# Patient Record
Sex: Female | Born: 1975 | Race: White | Hispanic: No | Marital: Married | Smoking: Never smoker
Health system: Southern US, Community
[De-identification: ages and names within clinical notes are randomized; demographics above are authoritative.]

## PROBLEM LIST (undated history)

## (undated) DIAGNOSIS — Z141 Cystic fibrosis carrier: Secondary | ICD-10-CM

## (undated) DIAGNOSIS — R87619 Unspecified abnormal cytological findings in specimens from cervix uteri: Secondary | ICD-10-CM

## (undated) DIAGNOSIS — R51 Headache: Secondary | ICD-10-CM

## (undated) DIAGNOSIS — IMO0002 Reserved for concepts with insufficient information to code with codable children: Secondary | ICD-10-CM

## (undated) DIAGNOSIS — K219 Gastro-esophageal reflux disease without esophagitis: Secondary | ICD-10-CM

## (undated) DIAGNOSIS — O09529 Supervision of elderly multigravida, unspecified trimester: Secondary | ICD-10-CM

## (undated) HISTORY — DX: Supervision of elderly multigravida, unspecified trimester: O09.529

## (undated) HISTORY — DX: Reserved for concepts with insufficient information to code with codable children: IMO0002

## (undated) HISTORY — PX: NO PAST SURGERIES: SHX2092

## (undated) HISTORY — DX: Unspecified abnormal cytological findings in specimens from cervix uteri: R87.619

---

## 2004-03-15 ENCOUNTER — Other Ambulatory Visit: Admission: RE | Admit: 2004-03-15 | Discharge: 2004-03-15 | Payer: Self-pay | Admitting: Gynecology

## 2005-03-23 ENCOUNTER — Other Ambulatory Visit: Admission: RE | Admit: 2005-03-23 | Discharge: 2005-03-23 | Payer: Self-pay | Admitting: Gynecology

## 2005-08-07 ENCOUNTER — Emergency Department (HOSPITAL_COMMUNITY): Admission: EM | Admit: 2005-08-07 | Discharge: 2005-08-07 | Payer: Self-pay | Admitting: Family Medicine

## 2007-05-09 HISTORY — PX: COLPOSCOPY: SHX161

## 2009-01-29 ENCOUNTER — Inpatient Hospital Stay (HOSPITAL_COMMUNITY): Admission: AD | Admit: 2009-01-29 | Discharge: 2009-01-31 | Payer: Self-pay | Admitting: Obstetrics and Gynecology

## 2010-08-12 LAB — COMPREHENSIVE METABOLIC PANEL
ALT: 17 U/L (ref 0–35)
AST: 25 U/L (ref 0–37)
Albumin: 2.6 g/dL — ABNORMAL LOW (ref 3.5–5.2)
Chloride: 105 mEq/L (ref 96–112)
Creatinine, Ser: 0.82 mg/dL (ref 0.4–1.2)
GFR calc Af Amer: >60 mL/min (ref >60)
Potassium: 4.3 mEq/L (ref 3.5–5.1)
Sodium: 137 mEq/L (ref 135–145)
Total Bilirubin: 0.4 mg/dL (ref 0.3–1.2)

## 2010-08-12 LAB — CBC
HCT: 29.2 % — ABNORMAL LOW (ref 36.0–46.0)
Hemoglobin: 10 g/dL — ABNORMAL LOW (ref 12.0–15.0)
MCV: 87.2 fL (ref 78.0–100.0)
Platelets: 197 10*3/uL (ref 150–400)
RDW: 13.8 % (ref 11.5–15.5)
WBC: 8.9 10*3/uL (ref 4.0–10.5)

## 2010-08-12 LAB — URIC ACID: Uric Acid, Serum: 5.8 mg/dL (ref 2.4–7.0)

## 2010-12-22 LAB — ANTIBODY SCREEN: Antibody Screen: NEGATIVE

## 2010-12-22 LAB — HEPATITIS B SURFACE ANTIGEN: Hepatitis B Surface Ag: NEGATIVE

## 2010-12-22 LAB — ABO/RH

## 2010-12-22 LAB — GC/CHLAMYDIA PROBE AMP, GENITAL: Gonorrhea: NEGATIVE

## 2010-12-22 LAB — RPR: RPR: NONREACTIVE

## 2011-04-26 ENCOUNTER — Other Ambulatory Visit (HOSPITAL_COMMUNITY): Payer: Self-pay | Admitting: Obstetrics and Gynecology

## 2011-04-26 DIAGNOSIS — IMO0002 Reserved for concepts with insufficient information to code with codable children: Secondary | ICD-10-CM

## 2011-04-27 ENCOUNTER — Other Ambulatory Visit (HOSPITAL_COMMUNITY): Payer: Self-pay | Admitting: Obstetrics and Gynecology

## 2011-04-27 ENCOUNTER — Ambulatory Visit (HOSPITAL_COMMUNITY)
Admission: RE | Admit: 2011-04-27 | Discharge: 2011-04-27 | Disposition: A | Discharge status: Home / Self Care | Payer: BC Managed Care – PPO | Source: Ambulatory Visit | Attending: Obstetrics and Gynecology | Admitting: Obstetrics and Gynecology

## 2011-04-27 ENCOUNTER — Encounter (HOSPITAL_COMMUNITY): Payer: Self-pay

## 2011-04-27 DIAGNOSIS — Z1389 Encounter for screening for other disorder: Secondary | ICD-10-CM | POA: Insufficient documentation

## 2011-04-27 DIAGNOSIS — Z363 Encounter for antenatal screening for malformations: Secondary | ICD-10-CM | POA: Insufficient documentation

## 2011-04-27 DIAGNOSIS — O358XX0 Maternal care for other (suspected) fetal abnormality and damage, not applicable or unspecified: Secondary | ICD-10-CM | POA: Insufficient documentation

## 2011-04-27 DIAGNOSIS — IMO0002 Reserved for concepts with insufficient information to code with codable children: Secondary | ICD-10-CM

## 2011-04-27 DIAGNOSIS — O43899 Other placental disorders, unspecified trimester: Secondary | ICD-10-CM | POA: Insufficient documentation

## 2011-04-27 DIAGNOSIS — O09529 Supervision of elderly multigravida, unspecified trimester: Secondary | ICD-10-CM | POA: Insufficient documentation

## 2011-04-27 DIAGNOSIS — O4100X0 Oligohydramnios, unspecified trimester, not applicable or unspecified: Secondary | ICD-10-CM | POA: Insufficient documentation

## 2011-05-02 ENCOUNTER — Inpatient Hospital Stay (HOSPITAL_COMMUNITY): Payer: BC Managed Care – PPO

## 2011-05-02 ENCOUNTER — Inpatient Hospital Stay (HOSPITAL_COMMUNITY)
Admission: AD | Admit: 2011-05-02 | Discharge: 2011-05-04 | DRG: 886 | Disposition: A | Discharge status: Home / Self Care | Payer: BC Managed Care – PPO | Source: Ambulatory Visit | Attending: Obstetrics and Gynecology | Admitting: Obstetrics and Gynecology

## 2011-05-02 ENCOUNTER — Encounter (HOSPITAL_COMMUNITY): Payer: Self-pay | Admitting: *Deleted

## 2011-05-02 DIAGNOSIS — K529 Noninfective gastroenteritis and colitis, unspecified: Secondary | ICD-10-CM

## 2011-05-02 DIAGNOSIS — A088 Other specified intestinal infections: Secondary | ICD-10-CM | POA: Diagnosis present

## 2011-05-02 DIAGNOSIS — O99891 Other specified diseases and conditions complicating pregnancy: Principal | ICD-10-CM | POA: Diagnosis present

## 2011-05-02 HISTORY — DX: Cystic fibrosis carrier: Z14.1

## 2011-05-02 HISTORY — DX: Gastro-esophageal reflux disease without esophagitis: K21.9

## 2011-05-02 LAB — DIFFERENTIAL
Basophils Absolute: 0 10*3/uL (ref 0.0–0.1)
Basophils Relative: 0 % (ref 0–1)
Eosinophils Absolute: 0 10*3/uL (ref 0.0–0.7)
Monocytes Absolute: 0.2 10*3/uL (ref 0.1–1.0)
Monocytes Relative: 2 % — ABNORMAL LOW (ref 3–12)
Neutrophils Relative %: 94 % — ABNORMAL HIGH (ref 43–77)

## 2011-05-02 LAB — COMPREHENSIVE METABOLIC PANEL
ALT: 14 U/L (ref 0–35)
AST: 17 U/L (ref 0–37)
Alkaline Phosphatase: 111 U/L (ref 39–117)
CO2: 22 mEq/L (ref 19–32)
Calcium: 8.5 mg/dL (ref 8.4–10.5)
Chloride: 104 mEq/L (ref 96–112)
GFR calc Af Amer: >90 mL/min (ref >90)
GFR calc non Af Amer: >90 mL/min (ref >90)
Glucose, Bld: 83 mg/dL (ref 70–99)
Potassium: 3.9 mEq/L (ref 3.5–5.1)
Sodium: 135 mEq/L (ref 135–145)
Total Bilirubin: 0.2 mg/dL — ABNORMAL LOW (ref 0.3–1.2)

## 2011-05-02 LAB — STREP B DNA PROBE

## 2011-05-02 LAB — CBC
HCT: 37.8 % (ref 36.0–46.0)
Hemoglobin: 13.2 g/dL (ref 12.0–15.0)
MCH: 29.3 pg (ref 26.0–34.0)
MCHC: 34.9 g/dL (ref 30.0–36.0)

## 2011-05-02 MED ORDER — PROMETHAZINE HCL 25 MG/ML IJ SOLN
12.5000 mg | Freq: Four times a day (QID) | INTRAMUSCULAR | Status: DC | PRN
  Administered 2011-05-02: 12.5 mg via INTRAVENOUS
  Filled 2011-05-02: qty 1

## 2011-05-02 MED ORDER — DOCUSATE SODIUM 100 MG PO CAPS
100.0000 mg | ORAL_CAPSULE | Freq: Every day | ORAL | Status: DC
  Filled 2011-05-02 (×2): qty 1

## 2011-05-02 MED ORDER — ONDANSETRON HCL 4 MG/2ML IJ SOLN
4.0000 mg | Freq: Once | INTRAMUSCULAR | Status: AC
  Administered 2011-05-02: 4 mg via INTRAVENOUS
  Filled 2011-05-02 (×2): qty 2

## 2011-05-02 MED ORDER — PROMETHAZINE HCL 25 MG/ML IJ SOLN
25.0000 mg | Freq: Four times a day (QID) | INTRAMUSCULAR | Status: DC | PRN
  Filled 2011-05-02: qty 1

## 2011-05-02 MED ORDER — PRENATAL MULTIVITAMIN CH: 1.0000 | ORAL_TABLET | Freq: Every day | ORAL | Status: DC

## 2011-05-02 MED ORDER — LACTATED RINGERS IV SOLN
INTRAVENOUS | Status: DC
  Administered 2011-05-02: 16:00:00 via INTRAVENOUS

## 2011-05-02 MED ORDER — ONDANSETRON HCL 4 MG/2ML IJ SOLN
4.0000 mg | Freq: Three times a day (TID) | INTRAMUSCULAR | Status: DC | PRN
  Administered 2011-05-04: 4 mg via INTRAVENOUS
  Filled 2011-05-02: qty 2

## 2011-05-02 MED ORDER — DEXTROSE IN LACTATED RINGERS 5 % IV SOLN
INTRAVENOUS | Status: DC
  Administered 2011-05-02 – 2011-05-04 (×4): via INTRAVENOUS

## 2011-05-02 MED ORDER — ZOLPIDEM TARTRATE 10 MG PO TABS
10.0000 mg | ORAL_TABLET | Freq: Every evening | ORAL | Status: DC | PRN
  Administered 2011-05-04: 10 mg via ORAL
  Filled 2011-05-02: qty 1

## 2011-05-02 MED ORDER — CALCIUM CARBONATE ANTACID 500 MG PO CHEW
2.0000 | CHEWABLE_TABLET | ORAL | Status: DC | PRN
  Filled 2011-05-02: qty 2

## 2011-05-02 MED ORDER — ACETAMINOPHEN 325 MG PO TABS
650.0000 mg | ORAL_TABLET | ORAL | Status: DC | PRN
  Administered 2011-05-02 – 2011-05-03 (×3): 650 mg via ORAL
  Filled 2011-05-02 (×3): qty 2

## 2011-05-02 MED ORDER — PANTOPRAZOLE SODIUM 40 MG IV SOLR
40.0000 mg | Freq: Once | INTRAVENOUS | Status: AC
  Administered 2011-05-02: 40 mg via INTRAVENOUS
  Filled 2011-05-02: qty 40

## 2011-05-02 NOTE — Progress Notes (Signed)
Dr Rana Snare notified regarding pt request for Protonix. Order received. Also made aware of FHR tracing, and pt complaints of lower abdominal cramping. Order also received to modify previous Phenergan order.

## 2011-05-02 NOTE — H&P (Signed)
Kristen Peterson is a 35 y.o. female presenting for severe diarrhea, nausea and vomiting since 1030 this am.  Her child had a viral syndrome similar to this 2 days ago and her husband now has it as well.  GFM,  no ctxs.  Does have a history of oligohydraminos last pregnancy. History OB History    Grav Para Term Preterm Abortions TAB SAB Ect Mult Living   3 1 1  0 1 0 1 0 0 1     Past Medical History  Diagnosis Date  . No pertinent past medical history    Past Surgical History  Procedure Date  . No past surgeries    Family History: family history includes Diverticulitis in her father; Hyperlipidemia in her father; Hypertension in her father; Osteoporosis in her mother; and Thyroid disease in her mother. Social History:  reports that she has quit smoking. She does not have any smokeless tobacco history on file. She reports that she does not drink alcohol or use illicit drugs.  ROS    Blood pressure 122/66, pulse 81, temperature 98.3 F (36.8 C), temperature source Oral, resp. rate 18, last menstrual period 09/09/2010, SpO2 99.00%. Exam Physical Exam   Gen:  Patient appears weak and generalized malaise abd gravid nt  FHR - had several variable decels so had BPP 8/8 with normal AFI Ctxs none  Prenatal labs: ABO, Rh:   Antibody:   Rubella:   RPR:    HBsAg:    HIV:    GBS:     Assessment/Plan: IUP at 33+weeks with GI viral syndrome.   Plan prolonged IVF and fetal monitoring with antiemetics   Ved Martos C 05/02/2011, 7:27 PM

## 2011-05-02 NOTE — Progress Notes (Signed)
Pt states she has been vomiting and has had diarrhea since about 1030 this morning. Husband at home with flu and small child that had vomiting and diarrhea 2nights ago

## 2011-05-02 NOTE — ED Provider Notes (Signed)
History     CSN: [id number]  Arrival date & time 05/02/11  1503   None     Chief Complaint  Patient presents with  . Emesis  . Diarrhea    HPI Kristen Peterson is a 35 y.o. female @ [redacted]w[redacted]d gestation who presents to [facility] for nausea, vomiting and diarrhea. Onset of symptoms at 10:30 am. Seems to be getting worse. Denies vaginal bleeding or leaking of fluid. Cramping abdominal pain with the diarrhea. Family has had the GI bug.  The history was provided by the patient.  Past Medical History  Diagnosis Date  . No pertinent past medical history     Past Surgical History  Procedure Date  . No past surgeries     Family History  Problem Relation Age of Onset  . Osteoporosis Mother   . Thyroid disease Mother   . Hypertension Father   . Hyperlipidemia Father   . Diverticulitis Father     History  Substance Use Topics  . Smoking status: Former Games developer  . Smokeless tobacco: Not on file  . Alcohol Use: No    OB History    Grav Para Term Preterm Abortions TAB SAB Ect Mult Living   3 1 1  0 1 0 1 0 0 1      Review of Systems  Constitutional: Positive for chills. Negative for fever.  Eyes: Negative.   Cardiovascular: Negative.   Gastrointestinal: Positive for nausea, vomiting, abdominal pain and diarrhea.  Genitourinary: Negative for vaginal bleeding, vaginal discharge and difficulty urinating.  Musculoskeletal: Positive for back pain.  Skin: Negative.   Neurological: Positive for headaches.  Psychiatric/Behavioral: Negative for confusion and agitation.    Allergies  Review of patient's allergies indicates no known allergies.  Home Medications  No current outpatient prescriptions on file.  BP 122/66  Pulse 89  Temp(Src) 98.3 F (36.8 C) (Oral)  Resp 18  LMP 09/09/2010  Physical Exam  Nursing note and vitals reviewed. Constitutional: She is oriented to person, place, and time. She appears well-developed and well-nourished.  HENT:  Head: Normocephalic.  Eyes: EOM  are normal.  Neck: Normal range of motion. Neck supple.  Cardiovascular: Normal rate.   Pulmonary/Chest: Effort normal.  Abdominal: Soft. There is no tenderness.       Gravid, consistent with dates.  Musculoskeletal: Normal range of motion.  Neurological: She is alert and oriented to person, place, and time. No cranial nerve deficit.  Skin: Skin is warm and dry.  Psychiatric: She has a normal mood and affect. Her behavior is normal. Judgment and thought content normal.   EFM: Initial tracing reactive and rare contraction. Baseline 150.  Patient now having variables 17:00 Dr. Rana Snare notified. Will order AFI and BPP.  Assessment: Gastroenteritis in third trimester pregnancy   Dehydration  Plan:  IV hydration   Zofran  ED Course: Dr. Rana Snare here to evaluate the patient and will admit for observation and monitoring.  Procedures  MDM          Kerrie Buffalo, NP 05/02/11 1939

## 2011-05-03 MED ORDER — OXYCODONE-ACETAMINOPHEN 5-325 MG PO TABS
2.0000 | ORAL_TABLET | ORAL | Status: DC | PRN
  Administered 2011-05-03: 2 via ORAL
  Filled 2011-05-03: qty 2

## 2011-05-03 MED ORDER — BUTALBITAL-APAP-CAFFEINE 50-325-40 MG PO TABS
1.0000 | ORAL_TABLET | Freq: Once | ORAL | Status: AC
  Administered 2011-05-03: 1 via ORAL
  Filled 2011-05-03: qty 1

## 2011-05-03 MED ORDER — PANTOPRAZOLE SODIUM 40 MG PO TBEC
40.0000 mg | DELAYED_RELEASE_TABLET | Freq: Every day | ORAL | Status: DC
  Administered 2011-05-03 – 2011-05-04 (×2): 40 mg via ORAL
  Filled 2011-05-03 (×3): qty 1

## 2011-05-03 MED ORDER — DIPHENOXYLATE-ATROPINE 2.5-0.025 MG PO TABS
1.0000 | ORAL_TABLET | Freq: Three times a day (TID) | ORAL | Status: DC | PRN
  Administered 2011-05-03 – 2011-05-04 (×4): 1 via ORAL
  Filled 2011-05-03 (×4): qty 1

## 2011-05-03 NOTE — Progress Notes (Signed)
Feeling a little better.  Tolerating some PO intake  AF, VSS FHT +, did have 1 min decel to 60s this am.  Reassuring since then.    Gen - NAD, ill appearing Abd - gravid, NT  A/P:  Continue IV hydration and antiemetics prn [place] for BPP & dopplers w/ MFM

## 2011-05-03 NOTE — Progress Notes (Signed)
Dr Rana Snare notified of variable at [phone]. Updated on FHR and occasional contractions. No orders received. Also updated on pt status during night.

## 2011-05-04 ENCOUNTER — Inpatient Hospital Stay (HOSPITAL_COMMUNITY): Payer: BC Managed Care – PPO

## 2011-05-04 MED ORDER — FERROUS SULFATE 325 (65 FE) MG PO TABS: 325.0000 mg | ORAL_TABLET | Freq: Every day | ORAL | Status: DC

## 2011-05-04 NOTE — Progress Notes (Signed)
Patient tolerated regular food. BPP 8/8 today AFI 7.92 Will discharge home followup in office.

## 2011-05-04 NOTE — Progress Notes (Signed)
This am, Patient is feeling better. Is going to eat a regular breakfast. Husband may still be sick at home. Just returned from ultrasound - those results are pending.  Afebrile  Vital signs stable Fetal heart rate is reactive Abdomen is soft and non tender  Impression: Gastroenteritis 33 w 6 d IUP  PLAN: i think patient is improved  Evaluate ultrasound report Recheck at lunchtime - if improved and husband is better at home consider discharge then.

## 2011-05-04 NOTE — Discharge Summary (Signed)
ADMISSION DIAGNOSIS: GASTROENTERITIS IUP at 33 weeks  DISCHARGE DIAGNOSIS: Same  Hospital Course: 35 year old G3 P1011 at 81 w 6 days presents with gastroenteritis. She was admitted, placed on IV fluids, and antiemetics. She was discharged today in good condition. She advised to hydrate, return if nausea or vomiting were to recur. Return next week office visit.

## 2011-05-17 NOTE — Progress Notes (Signed)
Post discharge review completed. 

## 2011-05-22 ENCOUNTER — Inpatient Hospital Stay (HOSPITAL_COMMUNITY): Payer: BC Managed Care – PPO | Admitting: Anesthesiology

## 2011-05-22 ENCOUNTER — Encounter (HOSPITAL_COMMUNITY): Payer: Self-pay | Admitting: *Deleted

## 2011-05-22 ENCOUNTER — Encounter (HOSPITAL_COMMUNITY)
Admission: RE | Disposition: A | Discharge status: Home / Self Care | Payer: Self-pay | Source: Ambulatory Visit | Attending: Obstetrics and Gynecology

## 2011-05-22 ENCOUNTER — Other Ambulatory Visit: Payer: Self-pay | Admitting: Obstetrics and Gynecology

## 2011-05-22 ENCOUNTER — Encounter (HOSPITAL_COMMUNITY): Payer: Self-pay | Admitting: Anesthesiology

## 2011-05-22 ENCOUNTER — Inpatient Hospital Stay (HOSPITAL_COMMUNITY)
Admission: RE | Admit: 2011-05-22 | Discharge: 2011-05-26 | DRG: 371 | Disposition: A | Discharge status: Home / Self Care | Payer: BC Managed Care – PPO | Source: Ambulatory Visit | Attending: Obstetrics and Gynecology | Admitting: Obstetrics and Gynecology

## 2011-05-22 ENCOUNTER — Telehealth (HOSPITAL_COMMUNITY): Payer: Self-pay | Admitting: *Deleted

## 2011-05-22 DIAGNOSIS — O368190 Decreased fetal movements, unspecified trimester, not applicable or unspecified: Secondary | ICD-10-CM | POA: Diagnosis not present

## 2011-05-22 DIAGNOSIS — O365990 Maternal care for other known or suspected poor fetal growth, unspecified trimester, not applicable or unspecified: Secondary | ICD-10-CM | POA: Diagnosis present

## 2011-05-22 DIAGNOSIS — O321XX0 Maternal care for breech presentation, not applicable or unspecified: Secondary | ICD-10-CM | POA: Diagnosis present

## 2011-05-22 DIAGNOSIS — Z302 Encounter for sterilization: Secondary | ICD-10-CM

## 2011-05-22 DIAGNOSIS — O43899 Other placental disorders, unspecified trimester: Secondary | ICD-10-CM | POA: Diagnosis present

## 2011-05-22 DIAGNOSIS — O4100X0 Oligohydramnios, unspecified trimester, not applicable or unspecified: Principal | ICD-10-CM | POA: Diagnosis present

## 2011-05-22 HISTORY — DX: Headache: R51

## 2011-05-22 LAB — CBC
MCH: 29.4 pg (ref 26.0–34.0)
MCHC: 34.9 g/dL (ref 30.0–36.0)
MCV: 84.2 fL (ref 78.0–100.0)
Platelets: 223 10*3/uL (ref 150–400)
RBC: 4.36 MIL/uL (ref 3.87–5.11)
RDW: 12.9 % (ref 11.5–15.5)

## 2011-05-22 SURGERY — Surgical Case
Anesthesia: Spinal | Site: Abdomen | Wound class: Clean Contaminated

## 2011-05-22 MED ORDER — DIBUCAINE 1 % RE OINT: 1.0000 "application " | TOPICAL_OINTMENT | RECTAL | Status: DC | PRN

## 2011-05-22 MED ORDER — MEPERIDINE HCL 25 MG/ML IJ SOLN: 6.2500 mg | INTRAMUSCULAR | Status: DC | PRN

## 2011-05-22 MED ORDER — MENTHOL 3 MG MT LOZG: 1.0000 | LOZENGE | OROMUCOSAL | Status: DC | PRN

## 2011-05-22 MED ORDER — OXYTOCIN 20 UNITS IN LACTATED RINGERS INFUSION - SIMPLE
125.0000 mL/h | INTRAVENOUS | Status: DC
  Filled 2011-05-22: qty 1000

## 2011-05-22 MED ORDER — OXYTOCIN 20 UNITS IN LACTATED RINGERS INFUSION - SIMPLE
INTRAVENOUS | Status: DC | PRN
  Administered 2011-05-22: 20 [IU] via INTRAVENOUS

## 2011-05-22 MED ORDER — LACTATED RINGERS IV SOLN: INTRAVENOUS | Status: DC

## 2011-05-22 MED ORDER — LANOLIN HYDROUS EX OINT: 1.0000 "application " | TOPICAL_OINTMENT | CUTANEOUS | Status: DC | PRN

## 2011-05-22 MED ORDER — FENTANYL CITRATE 0.05 MG/ML IJ SOLN
INTRAMUSCULAR | Status: DC | PRN
  Administered 2011-05-22: 25 ug via INTRATHECAL

## 2011-05-22 MED ORDER — MORPHINE SULFATE 0.5 MG/ML IJ SOLN
INTRAMUSCULAR | Status: AC
  Filled 2011-05-22: qty 10

## 2011-05-22 MED ORDER — CITRIC ACID-SODIUM CITRATE 334-500 MG/5ML PO SOLN: 30.0000 mL | Freq: Once | ORAL | Status: DC

## 2011-05-22 MED ORDER — FENTANYL CITRATE 0.05 MG/ML IJ SOLN
INTRAMUSCULAR | Status: AC
  Filled 2011-05-22: qty 2

## 2011-05-22 MED ORDER — FERROUS SULFATE 325 (65 FE) MG PO TABS
325.0000 mg | ORAL_TABLET | Freq: Two times a day (BID) | ORAL | Status: DC
  Administered 2011-05-23 – 2011-05-26 (×6): 325 mg via ORAL
  Filled 2011-05-22 (×6): qty 1

## 2011-05-22 MED ORDER — IBUPROFEN 600 MG PO TABS
600.0000 mg | ORAL_TABLET | Freq: Four times a day (QID) | ORAL | Status: DC | PRN
  Filled 2011-05-22 (×6): qty 1

## 2011-05-22 MED ORDER — SIMETHICONE 80 MG PO CHEW
80.0000 mg | CHEWABLE_TABLET | Freq: Three times a day (TID) | ORAL | Status: DC
  Administered 2011-05-22 – 2011-05-26 (×13): 80 mg via ORAL

## 2011-05-22 MED ORDER — OXYCODONE-ACETAMINOPHEN 5-325 MG PO TABS
1.0000 | ORAL_TABLET | ORAL | Status: DC | PRN
  Administered 2011-05-23: 1 via ORAL
  Administered 2011-05-23: 2 via ORAL
  Administered 2011-05-23 (×2): 1 via ORAL
  Administered 2011-05-24: 2 via ORAL
  Administered 2011-05-24 (×2): 1 via ORAL
  Administered 2011-05-24: 2 via ORAL
  Administered 2011-05-24: 1 via ORAL
  Administered 2011-05-25 – 2011-05-26 (×4): 2 via ORAL
  Filled 2011-05-22 (×2): qty 1
  Filled 2011-05-22: qty 2
  Filled 2011-05-22 (×2): qty 1
  Filled 2011-05-22 (×2): qty 2
  Filled 2011-05-22: qty 1
  Filled 2011-05-22 (×2): qty 2
  Filled 2011-05-22: qty 1
  Filled 2011-05-22 (×2): qty 2

## 2011-05-22 MED ORDER — ONDANSETRON HCL 4 MG/2ML IJ SOLN
INTRAMUSCULAR | Status: DC | PRN
  Administered 2011-05-22: 4 mg via INTRAVENOUS

## 2011-05-22 MED ORDER — MEDROXYPROGESTERONE ACETATE 150 MG/ML IM SUSP: 150.0000 mg | INTRAMUSCULAR | Status: DC | PRN

## 2011-05-22 MED ORDER — MEASLES, MUMPS & RUBELLA VAC ~~LOC~~ INJ: 0.5000 mL | INJECTION | Freq: Once | SUBCUTANEOUS | Status: DC

## 2011-05-22 MED ORDER — DIPHENHYDRAMINE HCL 25 MG PO CAPS
25.0000 mg | ORAL_CAPSULE | Freq: Four times a day (QID) | ORAL | Status: DC | PRN
  Administered 2011-05-23: 25 mg via ORAL

## 2011-05-22 MED ORDER — DIPHENHYDRAMINE HCL 25 MG PO CAPS
25.0000 mg | ORAL_CAPSULE | ORAL | Status: DC | PRN
  Filled 2011-05-22 (×2): qty 1

## 2011-05-22 MED ORDER — ONDANSETRON HCL 4 MG PO TABS: 4.0000 mg | ORAL_TABLET | ORAL | Status: DC | PRN

## 2011-05-22 MED ORDER — SODIUM CHLORIDE 0.9 % IJ SOLN: 3.0000 mL | INTRAMUSCULAR | Status: DC | PRN

## 2011-05-22 MED ORDER — WITCH HAZEL-GLYCERIN EX PADS: 1.0000 "application " | MEDICATED_PAD | CUTANEOUS | Status: DC | PRN

## 2011-05-22 MED ORDER — BUPIVACAINE IN DEXTROSE 0.75-8.25 % IT SOLN
INTRATHECAL | Status: DC | PRN
  Administered 2011-05-22: 13.5 mg via INTRATHECAL

## 2011-05-22 MED ORDER — ONDANSETRON HCL 4 MG/2ML IJ SOLN
INTRAMUSCULAR | Status: AC
  Filled 2011-05-22: qty 2

## 2011-05-22 MED ORDER — EPHEDRINE 5 MG/ML INJ
INTRAVENOUS | Status: AC
  Filled 2011-05-22: qty 10

## 2011-05-22 MED ORDER — DEXTROSE IN LACTATED RINGERS 5 % IV SOLN
INTRAVENOUS | Status: DC
  Administered 2011-05-23: 04:00:00 via INTRAVENOUS

## 2011-05-22 MED ORDER — KETOROLAC TROMETHAMINE 30 MG/ML IJ SOLN
INTRAMUSCULAR | Status: AC
  Filled 2011-05-22: qty 1

## 2011-05-22 MED ORDER — KETOROLAC TROMETHAMINE 30 MG/ML IJ SOLN
30.0000 mg | Freq: Four times a day (QID) | INTRAMUSCULAR | Status: AC | PRN
  Administered 2011-05-22 – 2011-05-23 (×2): 30 mg via INTRAVENOUS
  Filled 2011-05-22: qty 1

## 2011-05-22 MED ORDER — SIMETHICONE 80 MG PO CHEW: 80.0000 mg | CHEWABLE_TABLET | ORAL | Status: DC | PRN

## 2011-05-22 MED ORDER — SODIUM CHLORIDE 0.9 % IV SOLN: 1.0000 ug/kg/h | INTRAVENOUS | Status: DC | PRN

## 2011-05-22 MED ORDER — KETOROLAC TROMETHAMINE 30 MG/ML IJ SOLN: 30.0000 mg | Freq: Four times a day (QID) | INTRAMUSCULAR | Status: AC | PRN

## 2011-05-22 MED ORDER — ONDANSETRON HCL 4 MG/2ML IJ SOLN: 4.0000 mg | INTRAMUSCULAR | Status: DC | PRN

## 2011-05-22 MED ORDER — METOCLOPRAMIDE HCL 5 MG/ML IJ SOLN: 10.0000 mg | Freq: Once | INTRAMUSCULAR | Status: DC | PRN

## 2011-05-22 MED ORDER — DIPHENHYDRAMINE HCL 50 MG/ML IJ SOLN: 25.0000 mg | INTRAMUSCULAR | Status: DC | PRN

## 2011-05-22 MED ORDER — NALBUPHINE HCL 10 MG/ML IJ SOLN
5.0000 mg | INTRAMUSCULAR | Status: DC | PRN
  Administered 2011-05-22 – 2011-05-23 (×2): 10 mg via SUBCUTANEOUS
  Filled 2011-05-22: qty 1

## 2011-05-22 MED ORDER — CITRIC ACID-SODIUM CITRATE 334-500 MG/5ML PO SOLN
30.0000 mL | Freq: Once | ORAL | Status: AC
  Administered 2011-05-22: 30 mL via ORAL
  Filled 2011-05-22: qty 15

## 2011-05-22 MED ORDER — ONDANSETRON HCL 4 MG/2ML IJ SOLN: 4.0000 mg | Freq: Three times a day (TID) | INTRAMUSCULAR | Status: DC | PRN

## 2011-05-22 MED ORDER — FENTANYL CITRATE 0.05 MG/ML IJ SOLN
25.0000 ug | INTRAMUSCULAR | Status: DC | PRN
  Administered 2011-05-22: 50 ug via INTRAVENOUS

## 2011-05-22 MED ORDER — OXYTOCIN 20 UNITS IN LACTATED RINGERS INFUSION - SIMPLE
INTRAVENOUS | Status: AC
  Administered 2011-05-22: 20 [IU] via INTRAVENOUS
  Filled 2011-05-22: qty 1000

## 2011-05-22 MED ORDER — LACTATED RINGERS IV SOLN
INTRAVENOUS | Status: DC
  Administered 2011-05-22 (×2): via INTRAVENOUS

## 2011-05-22 MED ORDER — DIPHENHYDRAMINE HCL 50 MG/ML IJ SOLN: 12.5000 mg | INTRAMUSCULAR | Status: DC | PRN

## 2011-05-22 MED ORDER — SCOPOLAMINE 1 MG/3DAYS TD PT72
1.0000 | MEDICATED_PATCH | Freq: Once | TRANSDERMAL | Status: DC
  Administered 2011-05-22: 1.5 mg via TRANSDERMAL

## 2011-05-22 MED ORDER — IBUPROFEN 600 MG PO TABS
600.0000 mg | ORAL_TABLET | Freq: Four times a day (QID) | ORAL | Status: DC
  Administered 2011-05-23 – 2011-05-26 (×11): 600 mg via ORAL
  Filled 2011-05-22 (×6): qty 1

## 2011-05-22 MED ORDER — NALBUPHINE HCL 10 MG/ML IJ SOLN
5.0000 mg | INTRAMUSCULAR | Status: DC | PRN
Start: 2011-05-22 — End: 2011-05-26

## 2011-05-22 MED ORDER — PANTOPRAZOLE SODIUM 40 MG PO TBEC
40.0000 mg | DELAYED_RELEASE_TABLET | Freq: Every day | ORAL | Status: DC
  Administered 2011-05-24 – 2011-05-26 (×3): 40 mg via ORAL
  Filled 2011-05-22 (×4): qty 1

## 2011-05-22 MED ORDER — TETANUS-DIPHTH-ACELL PERTUSSIS 5-2.5-18.5 LF-MCG/0.5 IM SUSP
0.5000 mL | Freq: Once | INTRAMUSCULAR | Status: DC
  Filled 2011-05-22: qty 0.5

## 2011-05-22 MED ORDER — PRENATAL MULTIVITAMIN CH
1.0000 | ORAL_TABLET | Freq: Every day | ORAL | Status: DC
  Administered 2011-05-23 – 2011-05-26 (×4): 1 via ORAL
  Filled 2011-05-22 (×4): qty 1

## 2011-05-22 MED ORDER — DEXTROSE 5 % IV SOLN
2.0000 g | Freq: Once | INTRAVENOUS | Status: AC
  Administered 2011-05-22: 2 g via INTRAVENOUS
  Filled 2011-05-22: qty 2

## 2011-05-22 MED ORDER — MORPHINE SULFATE (PF) 0.5 MG/ML IJ SOLN
INTRAMUSCULAR | Status: DC | PRN
  Administered 2011-05-22: .15 mg via INTRATHECAL

## 2011-05-22 MED ORDER — SCOPOLAMINE 1 MG/3DAYS TD PT72
MEDICATED_PATCH | TRANSDERMAL | Status: AC
  Filled 2011-05-22: qty 1

## 2011-05-22 MED ORDER — SENNOSIDES-DOCUSATE SODIUM 8.6-50 MG PO TABS
2.0000 | ORAL_TABLET | Freq: Every day | ORAL | Status: DC
  Administered 2011-05-22 – 2011-05-25 (×4): 2 via ORAL

## 2011-05-22 MED ORDER — OXYTOCIN 10 UNIT/ML IJ SOLN
INTRAMUSCULAR | Status: AC
  Filled 2011-05-22: qty 4

## 2011-05-22 MED ORDER — EPHEDRINE SULFATE 50 MG/ML IJ SOLN
INTRAMUSCULAR | Status: DC | PRN
  Administered 2011-05-22 (×2): 10 mg via INTRAVENOUS

## 2011-05-22 MED ORDER — NALOXONE HCL 0.4 MG/ML IJ SOLN: 0.4000 mg | INTRAMUSCULAR | Status: DC | PRN

## 2011-05-22 MED ORDER — FAMOTIDINE IN NACL 20-0.9 MG/50ML-% IV SOLN: 20.0000 mg | Freq: Once | INTRAVENOUS | Status: DC

## 2011-05-22 SURGICAL SUPPLY — 29 items
CHLORAPREP W/TINT 26ML (MISCELLANEOUS) ×2 IMPLANT
CLOTH BEACON ORANGE TIMEOUT ST (SAFETY) ×2 IMPLANT
DERMABOND ADVANCED (GAUZE/BANDAGES/DRESSINGS) ×1
DERMABOND ADVANCED .7 DNX12 (GAUZE/BANDAGES/DRESSINGS) ×1 IMPLANT
DRSG COVADERM 4X8 (GAUZE/BANDAGES/DRESSINGS) ×2 IMPLANT
ELECT REM PT RETURN 9FT ADLT (ELECTROSURGICAL) ×2
ELECTRODE REM PT RTRN 9FT ADLT (ELECTROSURGICAL) ×1 IMPLANT
EXTRACTOR VACUUM M CUP 4 TUBE (SUCTIONS) IMPLANT
GLOVE BIO SURGEON STRL SZ 6.5 (GLOVE) ×2 IMPLANT
GLOVE BIOGEL PI IND STRL 7.0 (GLOVE) ×2 IMPLANT
GLOVE BIOGEL PI INDICATOR 7.0 (GLOVE) ×2
GOWN PREVENTION PLUS LG XLONG (DISPOSABLE) ×4 IMPLANT
KIT ABG SYR 3ML LUER SLIP (SYRINGE) ×2 IMPLANT
NEEDLE HYPO 25X5/8 SAFETYGLIDE (NEEDLE) ×2 IMPLANT
NS IRRIG 1000ML POUR BTL (IV SOLUTION) ×2 IMPLANT
PACK C SECTION WH (CUSTOM PROCEDURE TRAY) ×2 IMPLANT
RETAINER VISCERAL (MISCELLANEOUS) ×2 IMPLANT
SLEEVE SCD COMPRESS KNEE MED (MISCELLANEOUS) ×2 IMPLANT
STAPLER VISISTAT 35W (STAPLE) ×2 IMPLANT
SUT CHROMIC 0 CT 802H (SUTURE) IMPLANT
SUT CHROMIC 0 CTX 36 (SUTURE) ×6 IMPLANT
SUT MNCRL AB 3-0 PS2 27 (SUTURE) IMPLANT
SUT MON AB-0 CT1 36 (SUTURE) ×2 IMPLANT
SUT PDS AB 0 CTX 60 (SUTURE) ×2 IMPLANT
SUT PLAIN 0 NONE (SUTURE) IMPLANT
SUT VIC AB 4-0 KS 27 (SUTURE) ×2 IMPLANT
TOWEL OR 17X24 6PK STRL BLUE (TOWEL DISPOSABLE) ×4 IMPLANT
TRAY FOLEY CATH 14FR (SET/KITS/TRAYS/PACK) IMPLANT
WATER STERILE IRR 1000ML POUR (IV SOLUTION) ×4 IMPLANT

## 2011-05-22 NOTE — Telephone Encounter (Signed)
Preadmission screen  

## 2011-05-22 NOTE — Anesthesia Procedure Notes (Signed)
Spinal  Patient location during procedure: OR Start time: 05/22/2011 5:20 PM Staffing Anesthesiologist: Tiran Sauseda A. Performed by: anesthesiologist  Preanesthetic Checklist Completed: patient identified, site marked, surgical consent, pre-op evaluation, timeout performed, IV checked, risks and benefits discussed and monitors and equipment checked Spinal Block Patient position: sitting Prep: site prepped and draped and DuraPrep Patient monitoring: heart rate, cardiac monitor, continuous pulse ox and blood pressure Approach: midline Location: L3-4 Injection technique: single-shot Needle Needle type: Sprotte  Needle gauge: 24 G Needle length: 9 cm Assessment Sensory level: T4 Additional Notes Patient tolerated procedure well. Adequate sensory level.

## 2011-05-22 NOTE — Anesthesia Preprocedure Evaluation (Signed)
Anesthesia Evaluation  Patient identified by MRN, date of birth, ID band Patient awake    Reviewed: Allergy & Precautions, H&P , NPO status , Patient's Chart, lab work & pertinent test results  Airway Mallampati: II TM Distance: >3 FB Neck ROM: Full    Dental No notable dental hx. (+) Teeth Intact   Pulmonary neg pulmonary ROS,  clear to auscultation  Pulmonary exam normal       Cardiovascular neg cardio ROS Regular Normal    Neuro/Psych  Headaches, Negative Psych ROS   GI/Hepatic Neg liver ROS, GERD-  Medicated and Controlled,  Endo/Other  Negative Endocrine ROS  Renal/GU negative Renal ROS  Genitourinary negative   Musculoskeletal   Abdominal Normal abdominal exam  (+)   Peds  Hematology negative hematology ROS (+)   Anesthesia Other Findings   Reproductive/Obstetrics (+) Pregnancy                           Anesthesia Physical Anesthesia Plan  ASA: II  Anesthesia Plan: Spinal   Post-op Pain Management:    Induction:   Airway Management Planned:   Additional Equipment:   Intra-op Plan:   Post-operative Plan:   Informed Consent: I have reviewed the patients History and Physical, chart, labs and discussed the procedure including the risks, benefits and alternatives for the proposed anesthesia with the patient or authorized representative who has indicated his/her understanding and acceptance.     Plan Discussed with: Anesthesiologist, CRNA and Surgeon  Anesthesia Plan Comments:         Anesthesia Quick Evaluation

## 2011-05-22 NOTE — Op Note (Addendum)
Cesarean Section Procedure Note   Kristen Peterson  05/22/2011  Indications: Breech Presentation, desires sterilization  Pre-operative Diagnosis: Breech, Oligo, Battledore Placenta; IUGR.   Post-operative Diagnosis: Same   Procedure:  Primary c-section, BTL  Surgeon: Surgeon(s) and Role:    * Zelphia Cairo - Primary   Assistants: none  Anesthesia: spinal   Specimen: Placenta, portion of bilateral fallopian tubes  Procedure Details:  The patient was seen in the Holding Room. The risks, benefits, complications, treatment options, and expected outcomes were discussed with the patient. The patient concurred with the proposed plan, giving informed consent. identified as [name] and the procedure verified as C-Section Delivery and BTL. A Time Out was held and the above information confirmed. Pt again confirmed that she desired sterilization. After induction of anesthesia, the patient was draped and prepped in the usual sterile manner. A transverse was made and carried down through the subcutaneous tissue to the fascia. Fascial incision was made and extended transversely. The fascia was separated from the underlying rectus tissue superiorly and inferiorly. The peritoneum was identified and entered. Peritoneal incision was extended longitudinally. The utero-vesical peritoneal reflection was incised transversely and the bladder flap was bluntly freed from the lower uterine segment. A low transverse uterine incision was made. Delivered from breech presentation was a female infant with Apgar scores of 8 at one minute and 9 at five minutes. Cord ph was sent the umbilical cord was clamped and cut cord blood was obtained for evaluation. The placenta was removed Intact and appeared to have a marginal cord insertion. The uterine outline, tubes and ovaries appeared normal}. The uterine incision was closed with running locked sutures of 0chromic gut.   Hemostasis was observed. Lavage was carried out until  clear.  The patient was again asked if she desired sterilization and she verified yes.  Fallopian tube was grasped with a babcock clamp.  A knuckle of tube was doubly tied off using plain gut suture.  Segment of tube was excised and hemostasis was observed.  Procedure was repeated on the opposite tube. Peritoneum was closed with 0 monocryl.The fascia was then reapproximated with running sutures of 0PDS.  The skin was closed with vicryl and dermabond Instrument, sponge, and needle counts were correct prior the abdominal closure and were correct at the conclusion of the case.    Findings:  vigerous female w/ apgars 8,9.  Normal pelvic anatomy.  Marginal cord insertion   Estimated Blood Loss: 500cc  Urine Output: clear  Specimens: @ORSPECIMEN @   Complications: no complications  Disposition: PACU - hemodynamically stable.   Maternal Condition: stable   Baby condition / location:  NICU  Attending Attestation: I was present and scrubbed for the entire procedure.   Signed: Surgeon(s): Zelphia Cairo

## 2011-05-22 NOTE — H&P (Signed)
36 yo G3P1011 @ 36+3 days presents for primary c-section.  Pt has been followed for IUGR, battledore placenta, and oligo.  [place] today shows breech infant w/ poor interval growth with EFW at 1740gms (0%) and AFI of 8.5cm (10%).  BPP 8/10 and pt complains of decreased FM.  Pt denies contractons, :LOF and VB.    Past History - See [place], SVD x 1  AF, VSS + FHT - NST non-reassuring Gen - NAD CV - RRr Lungs - clear bilaterally Abd - gravid, NT Ext - NT, no edema PV - deferred  US - findings as above  A/P:  Recommend delivery given poor interval growth, decrease FM and NR NST.  C-section because of breech presentation.  R/b/a d/w pt and informed consent obtained

## 2011-05-22 NOTE — Anesthesia Postprocedure Evaluation (Signed)
  Anesthesia Post-op Note  Patient: Kristen Peterson  Procedure(s) Performed:  CESAREAN SECTION - primary/edc 06/16/11  Patient Location: PACU  Anesthesia Type: Spinal  Level of Consciousness: awake, alert  and oriented  Airway and Oxygen Therapy: Patient Spontanous Breathing  Post-op Pain: none  Post-op Assessment: Post-op Vital signs reviewed, Patient's Cardiovascular Status Stable, Respiratory Function Stable, Patent Airway, No signs of Nausea or vomiting, Pain level controlled, No headache and No backache  Post-op Vital Signs: Reviewed and stable  Complications: No apparent anesthesia complications

## 2011-05-22 NOTE — Progress Notes (Signed)
Pt here from MD office for decreased amnoitic fluid, baby breech, denies pain or bleeding, +FM. For C/S today at 1730.

## 2011-05-22 NOTE — Transfer of Care (Signed)
Immediate Anesthesia Transfer of Care Note  Patient: Kristen Peterson  Procedure(s) Performed:  CESAREAN SECTION - primary/edc 06/16/11  Patient Location: PACU  Anesthesia Type: Spinal  Level of Consciousness: alert  and oriented  Airway & Oxygen Therapy: Patient Spontanous Breathing  Post-op Assessment: Report given to PACU RN and Post -op Vital signs reviewed and stable  Post vital signs: stable Filed Vitals:   05/22/11 1314  BP: 127/76  Pulse: 72  Temp: 37 C  Resp: 18    Complications: No apparent anesthesia complications

## 2011-05-23 ENCOUNTER — Encounter (HOSPITAL_COMMUNITY): Payer: Self-pay | Admitting: *Deleted

## 2011-05-23 LAB — CBC
Hemoglobin: 10.8 g/dL — ABNORMAL LOW (ref 12.0–15.0)
MCH: 28.6 pg (ref 26.0–34.0)
MCHC: 33.9 g/dL (ref 30.0–36.0)
Platelets: 167 10*3/uL (ref 150–400)
RDW: 12.9 % (ref 11.5–15.5)

## 2011-05-23 NOTE — Anesthesia Postprocedure Evaluation (Signed)
  Anesthesia Post-op Note  Patient: Kristen Peterson  Procedure(s) Performed:  CESAREAN SECTION - primary/edc 06/16/11  Patient Location: [facility]  Anesthesia Type: Spinal  Level of Consciousness: awake, alert  and oriented  Airway and Oxygen Therapy: Patient Spontanous Breathing  Post-op Pain: none  Post-op Assessment: Post-op Vital signs reviewed and Patient's Cardiovascular Status Stable  Post-op Vital Signs: Reviewed and stable  Complications: No apparent anesthesia complications

## 2011-05-23 NOTE — Progress Notes (Signed)
Subjective: Postpartum Day one: Cesarean Delivery Patient reports tolerating PO.    Objective: Vital signs in last 24 hours: Temp:  [97.5 F (36.4 C)-98.6 F (37 C)] 98.5 F (36.9 C) (01/15 1000) Pulse Rate:  [59-102] 80  (01/15 1000) Resp:  [15-19] 18  (01/15 1000) BP: (113-138)/(60-90) 126/86 mmHg (01/15 1000) SpO2:  [95 %-100 %] 97 % (01/15 1000) Weight:  [61.859 kg (136 lb 6 oz)] 61.859 kg (136 lb 6 oz) (01/14 1252)  Physical Exam:  General: alert Lochia: appropriate Uterine Fundus: firm Incision: healing well DVT Evaluation: No evidence of DVT seen on physical exam.   Basename 05/23/11 0537 05/22/11 1241  HGB 10.8* 12.8  HCT 31.9* 36.7    Assessment/Plan: Status post Cesarean section. Doing well postoperatively.  Continue current care.  Kristen Peterson S 05/23/2011, 12:41 PM

## 2011-05-23 NOTE — Progress Notes (Signed)
UR Chart review completed.  

## 2011-05-23 NOTE — Addendum Note (Signed)
Addendum  created 05/23/11 0747 by Madison Hickman   Modules edited:Notes Section

## 2011-05-23 NOTE — Progress Notes (Addendum)
Pt c/o intermittent pain starting in right shoulder joint and "shooting" down right arm.  States she loses strength in right hand when this occurs.  Pain lasts a few seconds, then stops.  Radial and ulnar pulses 2+.  ROM and strength in wrist, elbow, and shoulder normal.  Color normal.  Palpation of shoulder joint reveals hypertonic pectoralis muscle; normal tone in deltoid, infraspinatous, supraspinatous, and teres muscles.  Spoke to [place], Scientist, clinical (histocompatibility and immunogenetics) who stated she would speak to Dr. Jean Rosenthal, MD and that one of them would come see patient.

## 2011-05-23 NOTE — Progress Notes (Cosign Needed)
Subjective: Postpartum Day 1: Cesarean Delivery Patient reports tolerating PO and + flatus.  Baby stable in NICU , not on vent  Objective: Vital signs in last 24 hours: Temp:  [97.5 F (36.4 C)-98.6 F (37 C)] 98.2 F (36.8 C) (01/15 0526) Pulse Rate:  [59-102] 73  (01/15 0526) Resp:  [15-19] 18  (01/15 0600) BP: (113-138)/(60-90) 122/81 mmHg (01/15 0526) SpO2:  [95 %-100 %] 98 % (01/15 0700) Weight:  [61.859 kg (136 lb 6 oz)] 61.859 kg (136 lb 6 oz) (01/14 1252)  Physical Exam:  General: alert and cooperative Lochia: appropriate Uterine Fundus: firm Incision: healing well DVT Evaluation: No evidence of DVT seen on physical exam.   Basename 05/23/11 0537 05/22/11 1241  HGB 10.8* 12.8  HCT 31.9* 36.7    Assessment/Plan: Status post Cesarean section. Doing well postoperatively.  Continue current care.  Wynter Isaacs G 05/23/2011, 8:49 AM

## 2011-05-24 ENCOUNTER — Encounter (HOSPITAL_COMMUNITY): Payer: Self-pay | Admitting: Obstetrics and Gynecology

## 2011-05-24 NOTE — Progress Notes (Signed)
PSYCHOSOCIAL ASSESSMENT ~ MATERNAL/CHILD Name: Kristen Peterson                                                                                     Age: 37 days  Referral Date: 05/24/11 Reason/Source: NICU Support/NICU  I. FAMILY/HOME ENVIRONMENT A. Child's Legal Guardian _x__Parent(s) ___Grandparent ___Foster parent ___DSS_________________ Name: [name]                                     DOB: December 20, 1975           Age: 30  Address: [address] [place], [place], [address]  Name: Meloney Feld                                       DOB: 10/02/71           Age:60   Address: same  B. Other Household Members/Support Persons Name: Sophia (2.5)                    Relationship: sister              DOB ___/___/___                   Name:                                         Relationship:                        DOB ___/___/___                   Name:                                         Relationship:                        DOB ___/___/___                   Name:                                         Relationship:                        DOB ___/___/___  C. Other Support:   II. PSYCHOSOCIAL DATA A. Information Source  _x_Patient Interview  __Family Interview           _x_Other: chart  B. Event organiser _x_Employment: Leisure centre manager, Electronics engineer __Medicaid    County:                 _x_Private Insurance:                   __Self Pay  __Food Stamps   __WIC __Work First     __Public Housing     __Section 8    __Maternity Care Coordination/Child Service Coordination/Early Intervention  __School:                                                                         Grade:  __Other:   Salena Saner Cultural and Environment Information Cultural Issues Impacting Care: none known  III. STRENGTHS _x__Supportive family/friends _x__Adequate Resources _x__Compliance with  medical plan _x__Home prepared for Child (including basic supplies) _x__Understanding of illness      _x__Other: Pediatric follow up will be at [facility]. IV. RISK FACTORS AND CURRENT PROBLEMS         __x__No Problems Noted                                                                                                                                                                                                                                       Pt              Family     Substance Abuse                                                                ___              ___        Mental Illness  ___              ___  Family/Relationship Issues                                      ___               ___             Abuse/Neglect/Domestic Violence                                         ___         ___  Financial Resources                                        ___              ___             Transportation                                                                        ___               ___  DSS Involvement                                                                   ___              ___  Adjustment to Illness                                                               ___              ___  Knowledge/Cognitive Deficit                                                   ___              ___             Compliance with Treatment                                                 ___                ___  Basic Needs (food, housing, etc.)                                          ___              ___             Housing Concerns                                       ___              ___ Other_____________________________________________________________            V. SOCIAL WORK ASSESSMENT SW met with MOB in her third floor room to introduce myself, complete assessment and evaluate how family is coping with baby's admission  to NICU.  MOB was extremely pleasant and talkative.  She states that they were prepared the the possibility for baby to be small and have to go to the NICU.  She thinks this has helped them cope with the situation.  She was very calm and seems to be feeling comfortable with baby's care.  She states she has a wonderful support system and that her parents have travelled here from [place] to be with her.  FOB's mother lives in the area and cares for 26.3 year old sibling while parents are at work.  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  She states her mother in law will watch both children when she goes back to school until summer break.  She states that her husband is involved and supportive.  He repairs electronics for [employer] brother's business and therefore has a flexible schedule.  MOB states they have everything ready for baby at home and is prepared to get a preemie car seat if needed closer to discharge.  SW informed MOB of baby's eligibility for SSI and assisted her in completing the application.  MOB informed MOB that there have been a lot of changes in the past few months, including marriage, moving, a new job and now a new baby.  She hopes that her family will be able to relax a little more in the new year.  SW explained support services offered by NICU SWs and gave contact information.  MOB seemed very appreciative of SW's visit.  VI. SOCIAL WORK PLAN  ___No Further Intervention Required/No Barriers to Discharge   _x__Psychosocial Support and Ongoing Assessment of Needs   ___Patient/Family Education:   ___Child Protective Services Report   County___________ Date___/____/____   ___Information/Referral to [employer]_________________________   _x__Other: Guardian Life Insurance, SSI

## 2011-05-24 NOTE — Progress Notes (Cosign Needed)
Subjective: Postpartum Day 2: Cesarean Delivery Patient reports tolerating PO, + flatus and no problems voiding.  Baby in isolette, nipple feeding  Objective: Vital signs in last 24 hours: Temp:  [97.7 F (36.5 C)-98.6 F (37 C)] 98.6 F (37 C) (01/16 0536) Pulse Rate:  [76-89] 89  (01/16 0536) Resp:  [18] 18  (01/16 0536) BP: (99-126)/(64-86) 117/74 mmHg (01/16 0536) SpO2:  [97 %-99 %] 99 % (01/16 0536)  Physical Exam:  General: alert and cooperative Lochia: appropriate Uterine Fundus: firm Incision: healing well DVT Evaluation: No evidence of DVT seen on physical exam.   Basename 05/23/11 0537 05/22/11 1241  HGB 10.8* 12.8  HCT 31.9* 36.7    Assessment/Plan: Status post Cesarean section. Doing well postoperatively.  Continue current care.  Shaneal Barasch G 05/24/2011, 8:24 AM

## 2011-05-24 NOTE — Progress Notes (Signed)
05/24/11 1100  Clinical Encounter Type  Visited With Patient  Visit Type Initial  Spiritual Encounters  Spiritual Needs Emotional    Provided compassionate listening and served as witness to Kristen Peterson's story of complicated pregnancy and premature birth.  Pt receptive to pastoral presence and support, using gratitude and her patience from parenting and teaching as helpful coping tools.  Pt reports strong support from husband's local family and from her parents, visiting from [place].  Calm, grounded, focused on NICU visits, healing, and pumping; grateful for family support to care for her 15.40-year-old daughter at home.  Pt aware of ongoing chaplain availability on floor and in NICU.  [name], [place]  Chaplain [phone]

## 2011-05-25 ENCOUNTER — Inpatient Hospital Stay (HOSPITAL_COMMUNITY): Admission: RE | Admit: 2011-05-25 | Payer: BC Managed Care – PPO | Source: Ambulatory Visit

## 2011-05-25 NOTE — Progress Notes (Signed)
Subjective: Postpartum Day 3: Cesarean Delivery Patient reports tolerating PO, + BM and no problems voiding.  Baby stable in the NICU, nipple feeding well, temps  stable  Objective: Vital signs in last 24 hours: Temp:  [97.7 F (36.5 C)-98.3 F (36.8 C)] 97.7 F (36.5 C) (01/17 0527) Pulse Rate:  [74-94] 83  (01/17 0527) Resp:  [18] 18  (01/17 0527) BP: (106-143)/(66-89) 106/66 mmHg (01/17 0527) SpO2:  [97 %-100 %] 97 % (01/17 0527)  Physical Exam:  General: alert and cooperative Lochia: appropriate Uterine Fundus: firm Incision: healing well DVT Evaluation: No evidence of DVT seen on physical exam.   Basename 05/23/11 0537 05/22/11 1241  HGB 10.8* 12.8  HCT 31.9* 36.7    Assessment/Plan: Status post Cesarean section. Doing well postoperatively.  Continue current care.  CURTIS,CAROL G 05/25/2011, 8:33 AM

## 2011-05-26 ENCOUNTER — Encounter (HOSPITAL_COMMUNITY)
Admission: RE | Admit: 2011-05-26 | Discharge: 2011-05-26 | Disposition: A | Discharge status: Home / Self Care | Payer: BC Managed Care – PPO | Source: Ambulatory Visit | Attending: Obstetrics and Gynecology | Admitting: Obstetrics and Gynecology

## 2011-05-26 DIAGNOSIS — O923 Agalactia: Secondary | ICD-10-CM | POA: Insufficient documentation

## 2011-05-26 MED ORDER — OXYCODONE-ACETAMINOPHEN 5-325 MG PO TABS: 1.0000 | ORAL_TABLET | ORAL | Status: AC | PRN

## 2011-05-26 MED ORDER — IBUPROFEN 600 MG PO TABS: 600.0000 mg | ORAL_TABLET | Freq: Four times a day (QID) | ORAL | Status: AC

## 2011-05-26 NOTE — Discharge Summary (Signed)
Obstetric Discharge Summary Reason for Admission: cesarean section Prenatal Procedures: ultrasound Intrapartum Procedures: cesarean: low cervical, transverse Postpartum Procedures: none Complications-Operative and Postpartum: none Hemoglobin  Date Value Range Status  05/23/2011 10.8* 12.0-15.0 (g/dL) Final     HCT  Date Value Range Status  05/23/2011 31.9* 36.0-46.0 (%) Final    Discharge Diagnoses: s/p cesarean delivery at 36.3 fori oligo, IUGR  Discharge Information: Date: 05/26/2011 Activity: pelvic rest Diet: routine Medications: PNV, Ibuprofen and Percocet Condition: stable Instructions: refer to practice specific booklet Discharge to: home   Newborn Data: Live born female  Birth Weight: 3 lb 9.8 oz (1638 g) APGAR: 8, 9  Home with baby in NICU.  [name] G 05/26/2011, 9:08 AM

## 2011-05-26 NOTE — Progress Notes (Signed)
Subjective: Postpartum Day 4: Cesarean Delivery Patient reports tolerating PO, + BM and no problems voiding.    Objective: Vital signs in last 24 hours: Temp:  [97.3 F (36.3 C)-98.1 F (36.7 C)] 98 F (36.7 C) (01/18 0636) Pulse Rate:  [76-82] 76  (01/18 0636) Resp:  [18] 18  (01/18 0636) BP: (113-148)/(73-89) 148/89 mmHg (01/18 0636) SpO2:  [97 %-98 %] 98 % (01/18 0636)  Physical Exam:  General: alert and cooperative Lochia: appropriate Uterine Fundus: firm Incision: healing well DVT Evaluation: No evidence of DVT seen on physical exam.  No results found for this basename: HGB:2,HCT:2 in the last 72 hours  Assessment/Plan: Status post Cesarean section. Doing well postoperatively.  Discharge home with standard precautions and return to clinic in 1 week.  Zabella Wease G 05/26/2011, 8:43 AM

## 2011-06-08 ENCOUNTER — Encounter (HOSPITAL_COMMUNITY): Payer: Self-pay

## 2011-06-08 ENCOUNTER — Inpatient Hospital Stay (HOSPITAL_COMMUNITY): Admit: 2011-06-08 | Payer: Self-pay | Admitting: Obstetrics and Gynecology

## 2011-06-08 SURGERY — Surgical Case
Anesthesia: Regional

## 2011-06-26 ENCOUNTER — Encounter (HOSPITAL_COMMUNITY): Payer: BC Managed Care – PPO | Attending: Obstetrics and Gynecology

## 2011-06-26 DIAGNOSIS — O923 Agalactia: Secondary | ICD-10-CM | POA: Insufficient documentation

## 2011-07-26 ENCOUNTER — Encounter (HOSPITAL_COMMUNITY)
Admission: RE | Admit: 2011-07-26 | Discharge: 2011-07-26 | Disposition: A | Discharge status: Home / Self Care | Payer: BC Managed Care – PPO | Source: Ambulatory Visit | Attending: Obstetrics and Gynecology | Admitting: Obstetrics and Gynecology

## 2011-07-26 DIAGNOSIS — O923 Agalactia: Secondary | ICD-10-CM | POA: Insufficient documentation

## 2011-08-26 ENCOUNTER — Encounter (HOSPITAL_COMMUNITY)
Admission: RE | Admit: 2011-08-26 | Discharge: 2011-08-26 | Disposition: A | Discharge status: Home / Self Care | Payer: BC Managed Care – PPO | Source: Ambulatory Visit | Attending: Obstetrics and Gynecology | Admitting: Obstetrics and Gynecology

## 2011-08-26 DIAGNOSIS — O923 Agalactia: Secondary | ICD-10-CM | POA: Insufficient documentation

## 2011-09-26 ENCOUNTER — Encounter (HOSPITAL_COMMUNITY)
Admission: RE | Admit: 2011-09-26 | Discharge: 2011-09-26 | Disposition: A | Discharge status: Home / Self Care | Payer: BC Managed Care – PPO | Source: Ambulatory Visit | Attending: Obstetrics and Gynecology | Admitting: Obstetrics and Gynecology

## 2011-09-26 DIAGNOSIS — O923 Agalactia: Secondary | ICD-10-CM | POA: Insufficient documentation

## 2011-10-27 ENCOUNTER — Encounter (HOSPITAL_COMMUNITY)
Admission: RE | Admit: 2011-10-27 | Discharge: 2011-10-27 | Disposition: A | Discharge status: Home / Self Care | Payer: BC Managed Care – PPO | Source: Ambulatory Visit | Attending: Obstetrics and Gynecology | Admitting: Obstetrics and Gynecology

## 2011-10-27 DIAGNOSIS — O923 Agalactia: Secondary | ICD-10-CM | POA: Insufficient documentation

## 2011-11-27 ENCOUNTER — Encounter (HOSPITAL_COMMUNITY)
Admission: RE | Admit: 2011-11-27 | Discharge: 2011-11-27 | Disposition: A | Discharge status: Home / Self Care | Payer: BC Managed Care – PPO | Source: Ambulatory Visit | Attending: Obstetrics and Gynecology | Admitting: Obstetrics and Gynecology

## 2011-11-27 DIAGNOSIS — O923 Agalactia: Secondary | ICD-10-CM | POA: Insufficient documentation

## 2011-12-28 ENCOUNTER — Encounter (HOSPITAL_COMMUNITY)
Admission: RE | Admit: 2011-12-28 | Discharge: 2011-12-28 | Disposition: A | Discharge status: Home / Self Care | Payer: BC Managed Care – PPO | Source: Ambulatory Visit | Attending: Obstetrics and Gynecology | Admitting: Obstetrics and Gynecology

## 2011-12-28 DIAGNOSIS — O923 Agalactia: Secondary | ICD-10-CM | POA: Insufficient documentation

## 2012-01-28 ENCOUNTER — Encounter (HOSPITAL_COMMUNITY)
Admission: RE | Admit: 2012-01-28 | Discharge: 2012-01-28 | Disposition: A | Discharge status: Home / Self Care | Payer: BC Managed Care – PPO | Source: Ambulatory Visit | Attending: Obstetrics and Gynecology | Admitting: Obstetrics and Gynecology

## 2012-01-28 DIAGNOSIS — O923 Agalactia: Secondary | ICD-10-CM | POA: Insufficient documentation

## 2012-02-28 ENCOUNTER — Encounter (HOSPITAL_COMMUNITY)
Admission: RE | Admit: 2012-02-28 | Discharge: 2012-02-28 | Disposition: A | Discharge status: Home / Self Care | Payer: BC Managed Care – PPO | Source: Ambulatory Visit | Attending: Obstetrics and Gynecology | Admitting: Obstetrics and Gynecology

## 2012-02-28 DIAGNOSIS — O923 Agalactia: Secondary | ICD-10-CM | POA: Insufficient documentation

## 2012-03-30 ENCOUNTER — Encounter (HOSPITAL_COMMUNITY)
Admission: RE | Admit: 2012-03-30 | Discharge: 2012-03-30 | Disposition: A | Discharge status: Home / Self Care | Payer: BC Managed Care – PPO | Source: Ambulatory Visit | Attending: Obstetrics and Gynecology | Admitting: Obstetrics and Gynecology

## 2012-03-30 DIAGNOSIS — O923 Agalactia: Secondary | ICD-10-CM | POA: Insufficient documentation

## 2012-04-29 ENCOUNTER — Encounter (HOSPITAL_COMMUNITY)
Admission: RE | Admit: 2012-04-29 | Discharge: 2012-04-29 | Disposition: A | Discharge status: Home / Self Care | Payer: BC Managed Care – PPO | Source: Ambulatory Visit | Attending: Obstetrics and Gynecology | Admitting: Obstetrics and Gynecology

## 2012-04-29 DIAGNOSIS — O923 Agalactia: Secondary | ICD-10-CM | POA: Insufficient documentation

## 2014-03-09 ENCOUNTER — Encounter (HOSPITAL_COMMUNITY): Payer: Self-pay | Admitting: Obstetrics and Gynecology

## 2014-11-20 ENCOUNTER — Other Ambulatory Visit: Payer: Self-pay | Admitting: Obstetrics and Gynecology

## 2014-11-23 LAB — CYTOLOGY - PAP

## 2015-03-12 ENCOUNTER — Ambulatory Visit (INDEPENDENT_AMBULATORY_CARE_PROVIDER_SITE_OTHER): Payer: BC Managed Care – PPO | Admitting: Family Medicine

## 2015-03-12 ENCOUNTER — Encounter: Payer: Self-pay | Admitting: Family Medicine

## 2015-03-12 VITALS — BP 120/74 | HR 72 | Temp 98.3°F | Ht 68.5 in | Wt 118.0 lb

## 2015-03-12 DIAGNOSIS — Z23 Encounter for immunization: Secondary | ICD-10-CM | POA: Diagnosis not present

## 2015-03-12 DIAGNOSIS — F518 Other sleep disorders not due to a substance or known physiological condition: Secondary | ICD-10-CM

## 2015-03-12 DIAGNOSIS — M546 Pain in thoracic spine: Secondary | ICD-10-CM

## 2015-03-12 DIAGNOSIS — G4720 Circadian rhythm sleep disorder, unspecified type: Secondary | ICD-10-CM

## 2015-03-12 DIAGNOSIS — Z0000 Encounter for general adult medical examination without abnormal findings: Secondary | ICD-10-CM

## 2015-03-12 DIAGNOSIS — G478 Other sleep disorders: Secondary | ICD-10-CM

## 2015-03-12 MED ORDER — IBUPROFEN 800 MG PO TABS: 800.0000 mg | ORAL_TABLET | Freq: Three times a day (TID) | ORAL | Status: DC | PRN

## 2015-03-12 NOTE — Progress Notes (Signed)
CC: Kristen Peterson is a 39 y.o. female is here for Establish Care   Subjective: HPI:  Very pleasant 39 year old here to establish care, wife of Kristen Peterson  She complains of nonrestorative sleep and difficulty staying asleep for more than 4 hours that has been going on for the majority of her life. She notes that her husband has noticed that she is snoring more often than she has in the past. She tells me that during the day she feels like she could lie down and take a nap at any moment. She's tried melatonin and Ambien but this did not help in the past.  When she get menstrual cramps she's been taking 800 mg of ibuprofen over-the-counter for relation, she wants to know if there is a single pill she can take instead of 4 over-the-counter tablets.  Review of Systems - General ROS: negative for - chills, fever, night sweats, weight gain or weight loss Ophthalmic ROS: negative for - decreased vision Psychological ROS: negative for - anxiety or depression ENT ROS: negative for - hearing change, nasal congestion, tinnitus or allergies Hematological and Lymphatic ROS: negative for - bleeding problems, bruising or swollen lymph nodes Breast ROS: negative Respiratory ROS: no cough, shortness of breath, or wheezing Cardiovascular ROS: no chest pain or dyspnea on exertion Gastrointestinal ROS: no abdominal pain, change in bowel habits, or black or bloody stools Genito-Urinary ROS: negative for - genital discharge, genital ulcers, incontinence or abnormal bleeding from genitals Musculoskeletal ROS: negative for - joint pain or muscle pain other than midline back pain between the scapulas Neurological ROS: negative for - headaches or memory loss Dermatological ROS: negative for lumps, mole changes, rash and skin lesion changes  Past Medical History  Diagnosis Date  . GERD (gastroesophageal reflux disease)   . Cystic fibrosis carrier   . Abnormal Pap smear     ASCUS + HPV  . Cystic fibrosis carrier   .  AMA (advanced maternal age) multigravida 35+   . ZOXWRUEA(540.9Headache(784.0)     Past Surgical History  Procedure Laterality Date  . No past surgeries    . Colposcopy  2009  . Cesarean section  05/22/2011    Procedure: CESAREAN SECTION;  Surgeon: Zelphia CairoGretchen Adkins, MD;  Location: [facility];  Service: Gynecology;  Laterality: N/A;  primary/edc 06/16/11   Family History  Problem Relation Age of Onset  . Osteoporosis Mother   . Thyroid disease Mother   . Hypertension Father   . Hyperlipidemia Father   . Diverticulitis Father   . Hypertension Maternal Grandmother   . Cancer Maternal Grandmother     breast  . Osteoporosis Maternal Grandmother   . Thyroid disease Maternal Grandmother   . Diabetes Maternal Grandmother   . Diabetes Maternal Grandfather   . Hypertension Maternal Grandfather     Social History   Social History  . Marital Status: Married    Spouse Name: N/A  . Number of Children: N/A  . Years of Education: N/A   Occupational History  . Not on file.   Social History Main Topics  . Smoking status: Never Smoker   . Smokeless tobacco: Never Used  . Alcohol Use: Yes  . Drug Use: No  . Sexual Activity: Yes    Birth Control/ Protection: None   Other Topics Concern  . Not on file   Social History Narrative     Objective: BP 120/74 mmHg  Pulse 72  Temp(Src) 98.3 F (36.8 C) (Oral)  Ht 5' 8.5" (1.74 m)  Wt  118 lb (53.524 kg)  BMI 17.68 kg/m2  General: No Acute Distress HEENT: Atraumatic, normocephalic, conjunctivae normal without scleral icterus.  No nasal discharge, hearing grossly intact, TMs with good landmarks bilaterally with no middle ear abnormalities, posterior pharynx clear without oral lesions. Neck: Supple, trachea midline, no cervical nor supraclavicular adenopathy. Pulmonary: Clear to auscultation bilaterally without wheezing, rhonchi, nor rales. Cardiac: Regular rate and rhythm.  No murmurs, rubs, nor gallops. No peripheral edema.  2+ peripheral pulses  bilaterally. Abdomen: Bowel sounds normal.  No masses.  Non-tender without rebound.  Negative Murphy's sign. MSK: Grossly intact, no signs of weakness.  Full strength throughout upper and lower extremities.  Full ROM in upper and lower extremities.  No midline spinal tenderness. Neuro: Gait unremarkable, CN II-XII grossly intact.  C5-C6 Reflex 2/4 Bilaterally, L4 Reflex 2/4 Bilaterally.  Cerebellar function intact. Skin: No rashes. Psych: Alert and oriented to person/place/time.  Thought process normal. No anxiety/depression.   Assessment & Plan: Kristen Peterson was seen today for establish care.  Diagnoses and all orders for this visit:  Annual physical exam -     Lipid panel -     COMPLETE METABOLIC PANEL WITH GFR -     CBC -     TSH  Non-restorative sleep -     Home sleep test  Disrupted sleep-wake cycle -     Home sleep test  Other orders -     ibuprofen (ADVIL,MOTRIN) 800 MG tablet; Take 1 tablet (800 mg total) by mouth every 8 (eight) hours as needed.   Healthy lifestyle interventions including but not limited to regular exercise, a healthy low fat diet, moderation of salt intake, the dangers of tobacco/alcohol/recreational drug use, nutrition supplementation, and accident avoidance were discussed with the patient and a handout was provided for future reference.  Will obtain home sleep test for further evaluation and possibility of sleep apnea.  Return if symptoms worsen or fail to improve.

## 2015-03-19 LAB — COMPLETE METABOLIC PANEL WITH GFR
ALT: 10 U/L (ref 6–29)
AST: 12 U/L (ref 10–30)
Albumin: 3.9 g/dL (ref 3.6–5.1)
Alkaline Phosphatase: 48 U/L (ref 33–115)
BILIRUBIN TOTAL: 0.4 mg/dL (ref 0.2–1.2)
BUN: 13 mg/dL (ref 7–25)
CALCIUM: 8.8 mg/dL (ref 8.6–10.2)
CO2: 27 mmol/L (ref 20–31)
Chloride: 105 mmol/L (ref 98–110)
Creat: 0.74 mg/dL (ref 0.50–1.10)
Glucose, Bld: 81 mg/dL (ref 65–99)
Potassium: 4.1 mmol/L (ref 3.5–5.3)
Sodium: 140 mmol/L (ref 135–146)
TOTAL PROTEIN: 6.3 g/dL (ref 6.1–8.1)

## 2015-03-19 LAB — CBC
HCT: 35.4 % — ABNORMAL LOW (ref 36.0–46.0)
Hemoglobin: 12.3 g/dL (ref 12.0–15.0)
MCH: 28.4 pg (ref 26.0–34.0)
MCHC: 34.7 g/dL (ref 30.0–36.0)
MCV: 81.8 fL (ref 78.0–100.0)
MPV: 9.6 fL (ref 8.6–12.4)
PLATELETS: 192 10*3/uL (ref 150–400)
RBC: 4.33 MIL/uL (ref 3.87–5.11)
RDW: 12.8 % (ref 11.5–15.5)
WBC: 5.3 10*3/uL (ref 4.0–10.5)

## 2015-03-19 LAB — LIPID PANEL
Cholesterol: 129 mg/dL (ref 125–200)
HDL: 56 mg/dL (ref >=46)
LDL CALC: 62 mg/dL (ref <130)
TRIGLYCERIDES: 54 mg/dL (ref <150)
Total CHOL/HDL Ratio: 2.3 Ratio (ref <=5.0)
VLDL: 11 mg/dL (ref <30)

## 2015-03-20 LAB — TSH: TSH: 1.808 u[IU]/mL (ref 0.350–4.500)

## 2015-03-22 ENCOUNTER — Telehealth: Payer: Self-pay

## 2015-03-22 DIAGNOSIS — R0681 Apnea, not elsewhere classified: Secondary | ICD-10-CM

## 2015-03-22 DIAGNOSIS — G478 Other sleep disorders: Secondary | ICD-10-CM

## 2015-03-22 NOTE — Telephone Encounter (Signed)
Pt.notified

## 2015-03-22 NOTE — Telephone Encounter (Signed)
[facility] called to let [facility] that [employer] denied the in home sleep study but approved the in lab sleep study.  They are asking for a new referral.

## 2015-03-22 NOTE — Telephone Encounter (Signed)
New referral placed, can you please update the patient about this change and why?

## 2015-04-05 ENCOUNTER — Ambulatory Visit (INDEPENDENT_AMBULATORY_CARE_PROVIDER_SITE_OTHER): Payer: BC Managed Care – PPO | Admitting: Family Medicine

## 2015-04-05 ENCOUNTER — Encounter: Payer: Self-pay | Admitting: Family Medicine

## 2015-04-05 VITALS — BP 114/78 | HR 83 | Temp 98.0°F | Wt 121.0 lb

## 2015-04-05 DIAGNOSIS — A499 Bacterial infection, unspecified: Secondary | ICD-10-CM

## 2015-04-05 DIAGNOSIS — J329 Chronic sinusitis, unspecified: Secondary | ICD-10-CM | POA: Diagnosis not present

## 2015-04-05 DIAGNOSIS — B9689 Other specified bacterial agents as the cause of diseases classified elsewhere: Secondary | ICD-10-CM

## 2015-04-05 MED ORDER — AMOXICILLIN-POT CLAVULANATE 500-125 MG PO TABS: ORAL_TABLET | ORAL | Status: DC

## 2015-04-05 NOTE — Progress Notes (Signed)
CC: Kristen Peterson Kristen Peterson is a 39 y.o. female is here for Sinusitis   Subjective: HPI:  Facial pain in the forehead along with nasal congestion that has been present for the last 2 weeks. Originally accompanied by a nonproductive cough but this was gone as of this weekend. Symptoms are present all day. Slightly improved with pseudoephedrine however causes sleep disturbances. No benefit from Mucinex. No other interventions as of yet. Originally had subjective fever however this resolved when the cough went away. She denies sore throat, shortness of breath, wheezing, chest pain or shortness of breath. Currently no fevers or chills   Review Of Systems Outlined In HPI  Past Medical History  Diagnosis Date  . GERD (gastroesophageal reflux disease)   . Cystic fibrosis carrier   . Abnormal Pap smear     ASCUS + HPV  . Cystic fibrosis carrier   . AMA (advanced maternal age) multigravida 35+   . WUJWJXBJ(478.2Headache(784.0)     Past Surgical History  Procedure Laterality Date  . No past surgeries    . Colposcopy  2009  . Cesarean section  05/22/2011    Procedure: CESAREAN SECTION;  Surgeon: Zelphia CairoGretchen Adkins, MD;  Location: [facility];  Service: Gynecology;  Laterality: N/A;  primary/edc 06/16/11   Family History  Problem Relation Age of Onset  . Osteoporosis Mother   . Thyroid disease Mother   . Hypertension Father   . Hyperlipidemia Father   . Diverticulitis Father   . Hypertension Maternal Grandmother   . Cancer Maternal Grandmother     breast  . Osteoporosis Maternal Grandmother   . Thyroid disease Maternal Grandmother   . Diabetes Maternal Grandmother   . Diabetes Maternal Grandfather   . Hypertension Maternal Grandfather     Social History   Social History  . Marital Status: Married    Spouse Name: N/A  . Number of Children: N/A  . Years of Education: N/A   Occupational History  . Not on file.   Social History Main Topics  . Smoking status: Never Smoker   . Smokeless tobacco: Never Used  .  Alcohol Use: Yes  . Drug Use: No  . Sexual Activity: Yes    Birth Control/ Protection: None   Other Topics Concern  . Not on file   Social History Narrative     Objective: BP 114/78 mmHg  Pulse 83  Temp(Src) 98 F (36.7 C) (Oral)  Wt 121 lb (54.885 kg)  General: Alert and Oriented, No Acute Distress HEENT: Pupils equal, round, reactive to light. Conjunctivae clear.  External ears unremarkable, canals clear with intact TMs with appropriate landmarks.  Middle ear appears open without effusion. Pink inferior turbinates.  Moist mucous membranes, pharynx without inflammation nor lesions.  Neck supple without palpable lymphadenopathy nor abnormal masses. Lungs: Clear to auscultation bilaterally, no wheezing/ronchi/rales.  Comfortable work of breathing. Good air movement. Extremities: No peripheral edema.  Strong peripheral pulses.  Mental Status: No depression, anxiety, nor agitation. Skin: Warm and dry.  Assessment & Plan: Dayja was seen today for sinusitis.  Diagnoses and all orders for this visit:  Bacterial sinusitis -     amoxicillin-clavulanate (AUGMENTIN) 500-125 MG tablet; Take one by mouth every 8 hours for ten total days.   Start Augmentin consider nasal saline washes. Start NyQuil as needed for help with sleep.  Return if symptoms worsen or fail to improve.

## 2015-04-07 ENCOUNTER — Telehealth: Payer: Self-pay

## 2015-04-07 MED ORDER — PREDNISONE 20 MG PO TABS
ORAL_TABLET | ORAL | Status: AC
Start: 2015-04-07 — End: 2015-04-12

## 2015-04-07 MED ORDER — LEVOFLOXACIN 500 MG PO TABS: 500.0000 mg | ORAL_TABLET | Freq: Every day | ORAL | Status: DC

## 2015-04-07 NOTE — Telephone Encounter (Signed)
Kristen Peterson reports a worsening of her symptoms, such as, pressure in face and congestion. She would like a different antibiotic. Please advise.

## 2015-04-07 NOTE — Telephone Encounter (Signed)
Substitution Rx of levofloxacin sent to her pharmacy.  Also I sent an Rx of prednisone to start in addition to levofloxacin if still not getting better.

## 2015-04-08 NOTE — Telephone Encounter (Signed)
Pt.notified

## 2015-09-07 ENCOUNTER — Encounter: Payer: Self-pay | Admitting: Family Medicine

## 2015-09-07 ENCOUNTER — Ambulatory Visit (INDEPENDENT_AMBULATORY_CARE_PROVIDER_SITE_OTHER): Payer: BC Managed Care – PPO | Admitting: Family Medicine

## 2015-09-07 VITALS — BP 123/83 | HR 75 | Temp 97.9°F | Wt 128.0 lb

## 2015-09-07 DIAGNOSIS — R110 Nausea: Secondary | ICD-10-CM | POA: Diagnosis not present

## 2015-09-07 MED ORDER — PANTOPRAZOLE SODIUM 40 MG PO TBEC: 40.0000 mg | DELAYED_RELEASE_TABLET | Freq: Every day | ORAL | Status: DC

## 2015-09-07 MED ORDER — METOCLOPRAMIDE HCL 5 MG PO TABS: 5.0000 mg | ORAL_TABLET | Freq: Three times a day (TID) | ORAL | Status: DC | PRN

## 2015-09-07 NOTE — Progress Notes (Signed)
CC: Kristen Peterson is a 40 y.o. female is here for Nausea   Subjective: HPI:  Complains of burning behind the sternum when lying down and intermittent episodes of nausea throughout the day. Symptoms have been present ever since Thursday when they were accompanied by diarrhea and vomiting which required a trip to the local emergency room for fluids and Zofran. She had a CT scan that was unremarkable along with blood work that was unremarkable. She tells me she is feeling much better from then have her symptoms are still interfere with quality of life. She denies any fevers, abdominal pain, genitourinary complaints but she has not had a bowel movement since Thursday. She feels bloated but doesn't experience any pain in the abdomen. She denies shortness of breath or chest pain other than that described above. Symptoms are overall mild in severity.   Review Of Systems Outlined In HPI  Past Medical History  Diagnosis Date  . GERD (gastroesophageal reflux disease)   . Cystic fibrosis carrier   . Abnormal Pap smear     ASCUS + HPV  . Cystic fibrosis carrier   . AMA (advanced maternal age) multigravida 35+   . XLKGMWNU(272.5Headache(784.0)     Past Surgical History  Procedure Laterality Date  . No past surgeries    . Colposcopy  2009  . Cesarean section  05/22/2011    Procedure: CESAREAN SECTION;  Surgeon: Zelphia CairoGretchen Adkins, MD;  Location: [facility];  Service: Gynecology;  Laterality: N/A;  primary/edc 06/16/11   Family History  Problem Relation Age of Onset  . Osteoporosis Mother   . Thyroid disease Mother   . Hypertension Father   . Hyperlipidemia Father   . Diverticulitis Father   . Hypertension Maternal Grandmother   . Cancer Maternal Grandmother     breast  . Osteoporosis Maternal Grandmother   . Thyroid disease Maternal Grandmother   . Diabetes Maternal Grandmother   . Diabetes Maternal Grandfather   . Hypertension Maternal Grandfather     Social History   Social History  . Marital Status:  Married    Spouse Name: N/A  . Number of Children: N/A  . Years of Education: N/A   Occupational History  . Not on file.   Social History Main Topics  . Smoking status: Never Smoker   . Smokeless tobacco: Never Used  . Alcohol Use: Yes  . Drug Use: No  . Sexual Activity: Yes    Birth Control/ Protection: None   Other Topics Concern  . Not on file   Social History Narrative     Objective: BP 123/83 mmHg  Pulse 75  Temp(Src) 97.9 F (36.6 C) (Oral)  Wt 128 lb (58.06 kg)  Vital signs reviewed. General: Alert and Oriented, No Acute Distress HEENT: Pupils equal, round, reactive to light. Conjunctivae clear.  External ears unremarkable.  Moist mucous membranes. Lungs: Clear and comfortable work of breathing, speaking in full sentences without accessory muscle use. Cardiac: Regular rate and rhythm.  Neuro: CN II-XII grossly intact, gait normal. Extremities: No peripheral edema.  Strong peripheral pulses.  Mental Status: No depression, anxiety, nor agitation. Logical though process. Skin: Warm and dry.  Assessment & Plan: Kristen Peterson was seen today for nausea.  Diagnoses and all orders for this visit:  Nausea without vomiting -     pantoprazole (PROTONIX) 40 MG tablet; Take 1 tablet (40 mg total) by mouth daily. -     metoCLOPramide (REGLAN) 5 MG tablet; Take 1-2 tablets (5-10 mg total) by mouth 3 (  three) times daily as needed for nausea.   I suspect a lot of her symptoms will improve after having a bowel movement and therefore she will go get Senokot and try this overnight tonight. For now treat nausea with Reglan and she'll probably need to be on Protonix for at least a month   due to esophagitis after frequent episodes of vomiting last week.  25 minutes spent face-to-face during visit today of which at least 50% was counseling or coordinating care regarding: 1. Nausea without vomiting      Return if symptoms worsen or fail to improve.

## 2015-09-15 ENCOUNTER — Encounter: Payer: Self-pay | Admitting: Family Medicine

## 2015-09-15 ENCOUNTER — Ambulatory Visit (INDEPENDENT_AMBULATORY_CARE_PROVIDER_SITE_OTHER): Payer: BC Managed Care – PPO | Admitting: Family Medicine

## 2015-09-15 VITALS — BP 114/76 | HR 77 | Temp 98.0°F | Wt 125.0 lb

## 2015-09-15 DIAGNOSIS — B9689 Other specified bacterial agents as the cause of diseases classified elsewhere: Secondary | ICD-10-CM

## 2015-09-15 DIAGNOSIS — J329 Chronic sinusitis, unspecified: Secondary | ICD-10-CM

## 2015-09-15 DIAGNOSIS — A499 Bacterial infection, unspecified: Secondary | ICD-10-CM | POA: Diagnosis not present

## 2015-09-15 MED ORDER — AMOXICILLIN-POT CLAVULANATE 500-125 MG PO TABS: ORAL_TABLET | ORAL | Status: AC

## 2015-09-15 NOTE — Progress Notes (Signed)
CC: Kristen Peterson is a 40 y.o. female is here for Sinusitis   Subjective: HPI:  Face pain in the cheeks with nasal congestion, post nasal drip, chills, only slightly better with a nettipot. Started on Friday, was slowly getting better then rebounded and worsened last night. No benefit from otc cough and cold medicine.  Denies chest pain, sob, wheezing, nor headache.   Review Of Systems Outlined In HPI  Past Medical History  Diagnosis Date  . GERD (gastroesophageal reflux disease)   . Cystic fibrosis carrier   . Abnormal Pap smear     ASCUS + HPV  . Cystic fibrosis carrier   . AMA (advanced maternal age) multigravida 35+   . ZOXWRUEA(540.9Headache(784.0)     Past Surgical History  Procedure Laterality Date  . No past surgeries    . Colposcopy  2009  . Cesarean section  05/22/2011    Procedure: CESAREAN SECTION;  Surgeon: Zelphia CairoGretchen Adkins, MD;  Location: [facility];  Service: Gynecology;  Laterality: N/A;  primary/edc 06/16/11   Family History  Problem Relation Age of Onset  . Osteoporosis Mother   . Thyroid disease Mother   . Hypertension Father   . Hyperlipidemia Father   . Diverticulitis Father   . Hypertension Maternal Grandmother   . Cancer Maternal Grandmother     breast  . Osteoporosis Maternal Grandmother   . Thyroid disease Maternal Grandmother   . Diabetes Maternal Grandmother   . Diabetes Maternal Grandfather   . Hypertension Maternal Grandfather     Social History   Social History  . Marital Status: Married    Spouse Name: N/A  . Number of Children: N/A  . Years of Education: N/A   Occupational History  . Not on file.   Social History Main Topics  . Smoking status: Never Smoker   . Smokeless tobacco: Never Used  . Alcohol Use: Yes  . Drug Use: No  . Sexual Activity: Yes    Birth Control/ Protection: None   Other Topics Concern  . Not on file   Social History Narrative     Objective: BP 114/76 mmHg  Pulse 77  Temp(Src) 98 F (36.7 C) (Oral)  Wt 125 lb  (56.7 kg)  SpO2 98%  General: Alert and Oriented, No Acute Distress HEENT: Pupils equal, round, reactive to light. Conjunctivae clear.  External ears unremarkable, canals clear with intact TMs with appropriate landmarks.  Middle ear appears open without effusion. Pink inferior turbinates.  Moist mucous membranes, pharynx without inflammation nor lesions.  Neck supple without palpable lymphadenopathy nor abnormal masses. Lungs: Clear to auscultation bilaterally, no wheezing/ronchi/rales.  Comfortable work of breathing. Good air movement. Actual sinusitis Extremities: No peripheral edema.  Strong peripheral pulses.  Mental Status: No depression, anxiety, nor agitation. Skin: Warm and dry.  Assessment & Plan: Kristen Peterson was seen today for sinusitis.  Diagnoses and all orders for this visit:  Bacterial sinusitis -     amoxicillin-clavulanate (AUGMENTIN) 500-125 MG tablet; Take one by mouth every 8 hours for ten total days.   Call if no better by Friday   Return if symptoms worsen or fail to improve.

## 2015-10-04 ENCOUNTER — Other Ambulatory Visit: Payer: Self-pay | Admitting: Family Medicine

## 2015-12-07 LAB — HM PAP SMEAR: HM PAP: NEGATIVE

## 2015-12-08 LAB — HM MAMMOGRAPHY

## 2015-12-21 ENCOUNTER — Other Ambulatory Visit: Payer: Self-pay | Admitting: Family Medicine

## 2016-01-28 ENCOUNTER — Other Ambulatory Visit: Payer: Self-pay | Admitting: Family Medicine

## 2016-02-12 ENCOUNTER — Other Ambulatory Visit: Payer: Self-pay | Admitting: Family Medicine

## 2016-03-16 ENCOUNTER — Telehealth: Payer: Self-pay | Admitting: Family Medicine

## 2016-03-16 NOTE — Telephone Encounter (Signed)
Refill request for ibuprofen 800 mg 3 times daily received from [facility]. Patient requests three-month supply once. I've never seen this patient. Chronic NSAIDs can be somewhat dangerous. I called and left a message with the patient to call back to discuss this issue further.

## 2016-03-17 ENCOUNTER — Telehealth: Payer: Self-pay | Admitting: Family Medicine

## 2016-03-17 MED ORDER — IBUPROFEN 800 MG PO TABS: 800.0000 mg | ORAL_TABLET | Freq: Three times a day (TID) | ORAL | 3 refills | Status: DC | PRN

## 2016-03-17 NOTE — Telephone Encounter (Signed)
I discussed ibuprofen use with the patient. She takes this medication 3 times a day for for 5 days with her menstrual cycle and then intermittently rarely throughout the rest of the month. I think this is perfectly safe. Recommend also using Zantac or Pepcid to prevent stomach ulcers while using this medication for more than a few days. Refill sent in.

## 2016-04-27 ENCOUNTER — Other Ambulatory Visit: Payer: Self-pay | Admitting: Family Medicine

## 2016-06-02 ENCOUNTER — Other Ambulatory Visit: Payer: Self-pay | Admitting: Sports Medicine

## 2016-06-27 ENCOUNTER — Telehealth: Payer: Self-pay | Admitting: Family Medicine

## 2016-06-27 MED ORDER — OSELTAMIVIR PHOSPHATE 75 MG PO CAPS: 75.0000 mg | ORAL_CAPSULE | Freq: Every day | ORAL | 0 refills | Status: AC

## 2016-06-27 NOTE — Telephone Encounter (Addendum)
Daughter diagnosed with flu pt wants PPx.  Will send in tamiflu

## 2016-07-01 ENCOUNTER — Other Ambulatory Visit: Payer: Self-pay | Admitting: Sports Medicine

## 2016-07-22 ENCOUNTER — Other Ambulatory Visit: Payer: Self-pay | Admitting: Sports Medicine

## 2016-08-29 ENCOUNTER — Ambulatory Visit (INDEPENDENT_AMBULATORY_CARE_PROVIDER_SITE_OTHER): Payer: BC Managed Care – PPO | Admitting: Osteopathic Medicine

## 2016-08-29 ENCOUNTER — Encounter: Payer: Self-pay | Admitting: Osteopathic Medicine

## 2016-08-29 VITALS — BP 117/76 | HR 68 | Temp 97.7°F | Wt 129.0 lb

## 2016-08-29 DIAGNOSIS — B9789 Other viral agents as the cause of diseases classified elsewhere: Secondary | ICD-10-CM

## 2016-08-29 DIAGNOSIS — J069 Acute upper respiratory infection, unspecified: Secondary | ICD-10-CM

## 2016-08-29 MED ORDER — IPRATROPIUM BROMIDE 0.03 % NA SOLN: 2.0000 | Freq: Four times a day (QID) | NASAL | 0 refills | Status: DC

## 2016-08-29 MED ORDER — GUAIFENESIN-CODEINE 100-10 MG/5ML PO SYRP: 5.0000 mL | ORAL_SOLUTION | Freq: Three times a day (TID) | ORAL | 0 refills | Status: DC | PRN

## 2016-08-29 NOTE — Patient Instructions (Signed)
Medications & Home Remedies for Upper Respiratory Illness  Note: the following list assumes no pregnancy, normal liver & kidney function and no other drug interactions. Dr. Lyn Hollingshead has highlighted medications which are safe for you to use, but these may not be appropriate for everyone. Always ask a pharmacist or qualified medical provider if you have any questions!   Aches/Pains, Fever, Headache Acetaminophen (Tylenol) 500 mg tablets - take max 2 tablets (1000 mg) every 6 hours (4 times per day)  Ibuprofen (Motrin) 200 mg tablets - take max 4 tablets (800 mg) every 6 hours*  Sinus Congestion Prescription Atrovent as directed Nasal Saline if desired Oxymetolazone (Afrin, others) sparing use due to rebound congestion, NEVER use in kids Phenylephrine (Sudafed) 10 mg tablets every 4 hours (or the 12-hour formulation)* Diphenhydramine (Benadryl) 25 mg tablets - take max 2 tablets every 4 hours  Cough & Sore Throat Prescription cough pills or syrups as directed Dextromethorphan (Robitussin, others) - cough suppressant Guaifenesin (Robitussin, Mucinex, others) - expectorant (helps cough up mucus) (Dextromethorphan and Guaifenesin also come in a combination tablet) Lozenges w/ Benzocaine + Menthol (Cepacol) Honey - as much as you want! Teas which "coat the throat" - look for ingredients Elm Bark, Licorice Root, Marshmallow Root  Other Antibiotics if these are prescribed - take ALL, even if you're feeling better  Zinc Lozenges within 24 hours of symptoms onset - mixed evidence this shortens the duration of the common cold Don't waste your money on Vitamin C or Echinacea  *Caution in patients with high blood pressure

## 2016-08-29 NOTE — Progress Notes (Signed)
HPI: Kristen Peterson is a 41 y.o. female who presents to [facility] 08/29/16 for chief complaint of:  Chief Complaint  Patient presents with  . Cough    Acute Illness: . Location & Quality: chest congestion, sinus pressure, sore throat . Duration: 5 days . Modifying factors: has tried the following OTC/Rx medications: various cold/flu formulations to some relief    Past medical, social and family history reviewed.   Immune compromising conditions or other risk factors: none  Current medications and allergies reviewed.     Review of Systems:  Constitutional: subjective fever/chills  HEENT: inus headache, come sore throat, No  swollen glands  Cardiovascular: No chest pain  Respiratory:Yes  cough, No  shortness of breath  Gastrointestinal: No  nausea, No  vomiting,  No  diarrhea  Musculoskeletal:   Yes  myalgia/arthralgia  Skin/Integument:  No  rash   Detailed Exam:  BP 117/76   Pulse 68   Temp 97.7 F (36.5 C) (Oral)   Wt 129 lb (58.5 kg)   BMI 19.33 kg/m   Constitutional:   VSS, see above.   General Appearance: alert, well-developed, well-nourished, NAD  Eyes:   Normal lids and conjunctive, non-icteric sclera  Ears, Nose, Mouth, Throat:   Normal external inspection ears/nares  Normal mouth/lips/gums, MMM  normal TM  posterior pharynx without erythema, without exudate  nasal mucosa normal  Skin:  Normal inspection, no rash or concerning lesions noted on limited exam  Neck:   No masses, trachea midline. normal lymph nodes  Respiratory:   Normal respiratory effort.   No  wheeze/rhonchi/rales  Cardiovascular:   S1/S2 normal, no murmur/rub/gallop auscultated. RRR.   No results found for this or any previous visit (from the past 72 hour(s)). No results found.   ASSESSMENT/PLAN: Advised supportive care for likely viral infection - if symptoms are persisting into the end of this week (7+ days form  onset) call us and depending on predominant cough vs sinus pressure would consider treatment for bacterial bronchitis or sinusitis. List of appropriate OTC Rx and home remedies given, all questions answered   Viral URI with cough - Plan: guaiFENesin-codeine (ROBITUSSIN AC) 100-10 MG/5ML syrup, ipratropium (ATROVENT) 0.03 % nasal spray  Visit summary was printed for the patient with medications and pertinent instructions for patient to review. ER/RTC precautions reviewed. All questions answered. Return if symptoms worsen or fail to improve.

## 2016-09-09 ENCOUNTER — Other Ambulatory Visit: Payer: Self-pay | Admitting: Sports Medicine

## 2016-10-17 ENCOUNTER — Other Ambulatory Visit: Payer: Self-pay | Admitting: Sports Medicine

## 2016-11-13 ENCOUNTER — Ambulatory Visit (INDEPENDENT_AMBULATORY_CARE_PROVIDER_SITE_OTHER): Payer: BC Managed Care – PPO | Admitting: Family Medicine

## 2016-11-13 ENCOUNTER — Encounter: Payer: Self-pay | Admitting: Family Medicine

## 2016-11-13 VITALS — BP 127/64 | HR 73 | Wt 129.0 lb

## 2016-11-13 DIAGNOSIS — K219 Gastro-esophageal reflux disease without esophagitis: Secondary | ICD-10-CM

## 2016-11-13 DIAGNOSIS — M545 Low back pain, unspecified: Secondary | ICD-10-CM | POA: Insufficient documentation

## 2016-11-13 DIAGNOSIS — M4123 Other idiopathic scoliosis, cervicothoracic region: Secondary | ICD-10-CM | POA: Diagnosis not present

## 2016-11-13 DIAGNOSIS — M2170 Unequal limb length (acquired), unspecified site: Secondary | ICD-10-CM

## 2016-11-13 DIAGNOSIS — M419 Scoliosis, unspecified: Secondary | ICD-10-CM | POA: Insufficient documentation

## 2016-11-13 DIAGNOSIS — M542 Cervicalgia: Secondary | ICD-10-CM

## 2016-11-13 DIAGNOSIS — R454 Irritability and anger: Secondary | ICD-10-CM

## 2016-11-13 MED ORDER — PANTOPRAZOLE SODIUM 40 MG PO TBEC: 40.0000 mg | DELAYED_RELEASE_TABLET | Freq: Every day | ORAL | 3 refills | Status: DC

## 2016-11-13 MED ORDER — CYCLOBENZAPRINE HCL 5 MG PO TABS: 5.0000 mg | ORAL_TABLET | Freq: Every evening | ORAL | 1 refills | Status: DC | PRN

## 2016-11-13 NOTE — Progress Notes (Signed)
Kristen Peterson is a 41 y.o. female who presents to [facility] today for neck and lower back pain.   Neck pain: She states that the neck pain has been off and on for approximately the last 10 years, but is worse over the last month. She characterizes the neck pain as a muscular soreness and achy pain, localized to the sternocleidomastoid area. It is predominately right-sided. She has tried ibuprofen, multiple chiropractors, and different sleeping positions/pillows but has not found any lasting relief. She endorses a muscle relaxer helping to break one of the more painful episodes previously. Patient denies any numbness or tingling in her upper extremities. She has a history of scoliosis. Patient endorses some decrease in range of neck motion. She reports that she has not tried seeing a physical therapist for this pain.   Lower back pain: Patient reports stiff back pain in the morning prior to getting out of bed for the last 2 months. She states that this pain is relieved once she is up and moving around. It is sometimes worse with twisting and predominantly on the R lower back area.      Past Medical History:  Diagnosis Date  . Abnormal Pap smear    ASCUS + HPV  . AMA (advanced maternal age) multigravida 52+   . Cystic fibrosis carrier   . Cystic fibrosis carrier   . GERD (gastroesophageal reflux disease)   . ASNKNLZJ(673.4)    Past Surgical History:  Procedure Laterality Date  . CESAREAN SECTION  05/22/2011   Procedure: CESAREAN SECTION;  Surgeon: Marylynn Pearson, MD;  Location: [facility];  Service: Gynecology;  Laterality: N/A;  primary/edc 06/16/11  . COLPOSCOPY  2009  . NO PAST SURGERIES     Social History  Substance Use Topics  . Smoking status: Never Smoker  . Smokeless tobacco: Never Used  . Alcohol use Yes   family history includes Cancer in her maternal  grandmother; Diabetes in her maternal grandfather and maternal grandmother; Diverticulitis in her father; Hyperlipidemia in her father; Hypertension in her father, maternal grandfather, and maternal grandmother; Osteoporosis in her maternal grandmother and mother; Thyroid disease in her maternal grandmother and mother.  ROS as above:  Medications: Current Outpatient Prescriptions  Medication Sig Dispense Refill  . escitalopram (LEXAPRO) 10 MG tablet Take 10 mg by mouth daily.    Marland Kitchen ibuprofen (ADVIL,MOTRIN) 200 MG tablet Take 200 mg by mouth every 6 (six) hours as needed.    . pantoprazole (PROTONIX) 40 MG tablet Take 1 tablet (40 mg total) by mouth daily. 90 tablet 3  . cyclobenzaprine (FLEXERIL) 5 MG tablet Take 1-2 tablets (5-10 mg total) by mouth at bedtime as needed for muscle spasms. 90 tablet 1   No current facility-administered medications for this visit.    No Known Allergies  Health Maintenance Health Maintenance  Topic Date Due  . MAMMOGRAM  01/01/1993  . TETANUS/TDAP  11/21/2017 (Originally 01/01/1994)  . INFLUENZA VACCINE  12/06/2016  . PAP SMEAR  11/19/2017  . HIV Screening  Completed     Exam:  BP 127/64   Pulse 73   Wt 129 lb (58.5 kg)   BMI 19.33 kg/m  Gen: Well NAD HEENT: EOMI,  MMM Lungs: Normal work of breathing. CTABL Heart: RRR no MRG Abd: NABS, Soft. Nondistended, Nontender Exts: Brisk capillary refill, warm and well perfused.  MSK: Bilateral normal range of motion in upper extremities, no loss of sensation, equal strength  and tone. Cspine: Patient holds her neck and a slightly extended forward position with some head lateral flexion. The C-spine is nontender along the posterior midline she does have tenderness at the right paraspinal muscles and sternocleidomastoid. Neck range of motion is normal with slight decrease in head extension  Lumbar spine: Normal ROM on back flexion and extension, no pain with spine rotation  Leg length discrepancy right leg  is one half and shoulder than the left.  No results found for this or any previous visit (from the past 72 hour(s)). No results found.    Assessment and Plan: 41 y.o. female with neck and back pain. Her neck pain is most likely musculoskeletal in nature given its location, reproducibility, and patient's history of scoliosis. Patient's back pain may be associated with a rheumatologic etiology given her family history of autoimmune disorders, patient's characterization of the pain, and the pain being relieved with exercise and movement. To further assess the source(s) of these pains, patient will have thoracolumbar x-ray and being physical therapy. She will also have rheumatologic labs drawn for markers of autoimmune diseases. To help with her current pain, she will begin taking cyclobenzaprine 5 mg daily. In clinic, patient was also given a heel lift for to help offset her 1/2 inch limb length discrepancy.  Patient will follow-up in clinic in 6 weeks for further assessment and follow-up.   Orders Placed This Encounter  Procedures  . DG Cervical Spine Complete    Standing Status:   Future    Standing Expiration Date:   01/14/2018    Order Specific Question:   Reason for Exam (SYMPTOM  OR DIAGNOSIS REQUIRED)    Answer:   eval neck pain ? scoliolis    Order Specific Question:   Is patient pregnant?    Answer:   No    Order Specific Question:   Preferred imaging location?    Answer:   [facility]    Order Specific Question:   Radiology Contrast Protocol - do NOT remove file path    Answer:   \\[email or web address]\Radiant\DXFluoroContrastProtocols.pdf  . DG THORACOLUMABAR SPINE    Standing Status:   Future    Standing Expiration Date:   01/14/2018    Order Specific Question:   Reason for Exam (SYMPTOM  OR DIAGNOSIS REQUIRED)    Answer:   eval socilosis    Order Specific Question:   Is patient pregnant?    Answer:   No    Order Specific Question:   Preferred imaging location?     Answer:   [facility]    Order Specific Question:   Radiology Contrast Protocol - do NOT remove file path    Answer:   \\[email or web address]\Radiant\DXFluoroContrastProtocols.pdf  . CBC  . COMPLETE METABOLIC PANEL WITH GFR  . TSH  . VITAMIN D 25 Hydroxy (Vit-D Deficiency, Fractures)  . Sedimentation rate  . Rheumatoid factor  . ANA  . HLA-B27 antigen  . Ambulatory referral to Physical Therapy    Referral Priority:   Routine    Referral Type:   Physical Medicine    Referral Reason:   Specialty Services Required    Requested Specialty:   Physical Therapy   Meds ordered this encounter  Medications  . escitalopram (LEXAPRO) 10 MG tablet    Sig: Take 10 mg by mouth daily.  Marland Kitchen ibuprofen (ADVIL,MOTRIN) 200 MG tablet    Sig: Take 200 mg by mouth every 6 (six) hours as needed.  . cyclobenzaprine (FLEXERIL) 5 MG tablet  Sig: Take 1-2 tablets (5-10 mg total) by mouth at bedtime as needed for muscle spasms.    Dispense:  90 tablet    Refill:  1  . pantoprazole (PROTONIX) 40 MG tablet    Sig: Take 1 tablet (40 mg total) by mouth daily.    Dispense:  90 tablet    Refill:  3     Discussed warning signs or symptoms. Please see discharge instructions. Patient expresses understanding.  I spent 40 minutes with this patient, greater than 50% was face-to-face time counseling regarding the above diagnosis.

## 2016-11-13 NOTE — Patient Instructions (Signed)
Thank you for coming in today. Attend PT.  Get xrays and labs soon.  Recheck with me in 6 weeks or sooner if needed.  Continue the left foot leg lift.    Cervical Strain and Sprain Rehab Ask your health care provider which exercises are safe for you. Do exercises exactly as told by your health care provider and adjust them as directed. It is normal to feel mild stretching, pulling, tightness, or discomfort as you do these exercises, but you should stop right away if you feel sudden pain or your pain gets worse.Do not begin these exercises until told by your health care provider. Stretching and range of motion exercises These exercises warm up your muscles and joints and improve the movement and flexibility of your neck. These exercises also help to relieve pain, numbness, and tingling. Exercise A: Cervical side bend  1. Using good posture, sit on a stable chair or stand up. 2. Without moving your shoulders, slowly tilt your left / right ear to your shoulder until you feel a stretch in your neck muscles. You should be looking straight ahead. 3. Hold for __________ seconds. 4. Repeat with the other side of your neck. Repeat __________ times. Complete this exercise __________ times a day. Exercise B: Cervical rotation  1. Using good posture, sit on a stable chair or stand up. 2. Slowly turn your head to the side as if you are looking over your left / right shoulder. ? Keep your eyes level with the ground. ? Stop when you feel a stretch along the side and the back of your neck. 3. Hold for __________ seconds. 4. Repeat this by turning to your other side. Repeat __________ times. Complete this exercise __________ times a day. Exercise C: Thoracic extension and pectoral stretch 1. Roll a towel or a small blanket so it is about 4 inches (10 cm) in diameter. 2. Lie down on your back on a firm surface. 3. Put the towel lengthwise, under your spine in the middle of your back. It should not be  not under your shoulder blades. The towel should line up with your spine from your middle back to your lower back. 4. Put your hands behind your head and let your elbows fall out to your sides. 5. Hold for __________ seconds. Repeat __________ times. Complete this exercise __________ times a day. Strengthening exercises These exercises build strength and endurance in your neck. Endurance is the ability to use your muscles for a long time, even after your muscles get tired. Exercise D: Upper cervical flexion, isometric 1. Lie on your back with a thin pillow behind your head and a small rolled-up towel under your neck. 2. Gently tuck your chin toward your chest and nod your head down to look toward your feet. Do not lift your head off the pillow. 3. Hold for __________ seconds. 4. Release the tension slowly. Relax your neck muscles completely before you repeat this exercise. Repeat __________ times. Complete this exercise __________ times a day. Exercise E: Cervical extension, isometric  1. Stand about 6 inches (15 cm) away from a wall, with your back facing the wall. 2. Place a soft object, about 6-8 inches (15-20 cm) in diameter, between the back of your head and the wall. A soft object could be a small pillow, a ball, or a folded towel. 3. Gently tilt your head back and press into the soft object. Keep your jaw and forehead relaxed. 4. Hold for __________ seconds. 5. Release the tension  slowly. Relax your neck muscles completely before you repeat this exercise. Repeat __________ times. Complete this exercise __________ times a day. Posture and body mechanics  Body mechanics refers to the movements and positions of your body while you do your daily activities. Posture is part of body mechanics. Good posture and healthy body mechanics can help to relieve stress in your body's tissues and joints. Good posture means that your spine is in its natural S-curve position (your spine is neutral), your  shoulders are pulled back slightly, and your head is not tipped forward. The following are general guidelines for applying improved posture and body mechanics to your everyday activities. Standing  When standing, keep your spine neutral and keep your feet about hip-width apart. Keep a slight bend in your knees. Your ears, shoulders, and hips should line up.  When you do a task in which you stand in one place for a long time, place one foot up on a stable object that is 2-4 inches (5-10 cm) high, such as a footstool. This helps keep your spine neutral. Sitting   When sitting, keep your spine neutral and your keep feet flat on the floor. Use a footrest, if necessary, and keep your thighs parallel to the floor. Avoid rounding your shoulders, and avoid tilting your head forward.  When working at a desk or a computer, keep your desk at a height where your hands are slightly lower than your elbows. Slide your chair under your desk so you are close enough to maintain good posture.  When working at a computer, place your monitor at a height where you are looking straight ahead and you do not have to tilt your head forward or downward to look at the screen. Resting When lying down and resting, avoid positions that are most painful for you. Try to support your neck in a neutral position. You can use a contour pillow or a small rolled-up towel. Your pillow should support your neck but not push on it. This information is not intended to replace advice given to you by your health care provider. Make sure you discuss any questions you have with your health care provider. Document Released: 04/24/2005 Document Revised: 12/30/2015 Document Reviewed: 03/31/2015 Elsevier Interactive Patient Education  [employer]2018 [employer] Inc[employer]

## 2016-11-14 ENCOUNTER — Other Ambulatory Visit: Payer: Self-pay | Admitting: Sports Medicine

## 2016-11-15 ENCOUNTER — Ambulatory Visit (INDEPENDENT_AMBULATORY_CARE_PROVIDER_SITE_OTHER): Payer: BC Managed Care – PPO

## 2016-11-15 DIAGNOSIS — M545 Low back pain, unspecified: Secondary | ICD-10-CM

## 2016-11-15 DIAGNOSIS — M4184 Other forms of scoliosis, thoracic region: Secondary | ICD-10-CM | POA: Diagnosis not present

## 2016-11-15 DIAGNOSIS — M47892 Other spondylosis, cervical region: Secondary | ICD-10-CM | POA: Diagnosis not present

## 2016-11-15 DIAGNOSIS — M542 Cervicalgia: Secondary | ICD-10-CM

## 2016-11-15 DIAGNOSIS — M4123 Other idiopathic scoliosis, cervicothoracic region: Secondary | ICD-10-CM

## 2016-11-15 LAB — CBC
HCT: 38.3 % (ref 35.0–45.0)
Hemoglobin: 12.9 g/dL (ref 11.7–15.5)
MCH: 28.2 pg (ref 27.0–33.0)
MCHC: 33.7 g/dL (ref 32.0–36.0)
MCV: 83.8 fL (ref 80.0–100.0)
MPV: 9.6 fL (ref 7.5–12.5)
PLATELETS: 231 10*3/uL (ref 140–400)
RBC: 4.57 MIL/uL (ref 3.80–5.10)
RDW: 13.3 % (ref 11.0–15.0)
WBC: 4.7 10*3/uL (ref 3.8–10.8)

## 2016-11-15 LAB — COMPLETE METABOLIC PANEL WITH GFR
ALT: 20 U/L (ref 6–29)
AST: 17 U/L (ref 10–30)
Albumin: 4.3 g/dL (ref 3.6–5.1)
Alkaline Phosphatase: 51 U/L (ref 33–115)
BUN: 10 mg/dL (ref 7–25)
CHLORIDE: 102 mmol/L (ref 98–110)
CO2: 24 mmol/L (ref 20–31)
CREATININE: 0.81 mg/dL (ref 0.50–1.10)
Calcium: 8.9 mg/dL (ref 8.6–10.2)
GFR, Est Non African American: >89 mL/min (ref >=60)
Glucose, Bld: 78 mg/dL (ref 65–99)
POTASSIUM: 4 mmol/L (ref 3.5–5.3)
Sodium: 137 mmol/L (ref 135–146)
Total Bilirubin: 0.5 mg/dL (ref 0.2–1.2)
Total Protein: 6.6 g/dL (ref 6.1–8.1)

## 2016-11-15 LAB — TSH: TSH: 1.85 mIU/L

## 2016-11-16 LAB — SEDIMENTATION RATE: SED RATE: 1 mm/h (ref 0–20)

## 2016-11-16 LAB — ANA: Anti Nuclear Antibody(ANA): NEGATIVE

## 2016-11-16 LAB — VITAMIN D 25 HYDROXY (VIT D DEFICIENCY, FRACTURES): VIT D 25 HYDROXY: 38 ng/mL (ref 30–100)

## 2016-11-16 LAB — RHEUMATOID FACTOR: Rhuematoid fact SerPl-aCnc: <14 IU/mL (ref <14)

## 2016-11-21 LAB — HLA-B27 ANTIGEN: DNA Result:: NEGATIVE

## 2016-11-22 ENCOUNTER — Encounter: Payer: Self-pay | Admitting: Family Medicine

## 2016-11-23 ENCOUNTER — Encounter: Payer: Self-pay | Admitting: Family Medicine

## 2016-11-27 ENCOUNTER — Ambulatory Visit (INDEPENDENT_AMBULATORY_CARE_PROVIDER_SITE_OTHER): Payer: BC Managed Care – PPO | Admitting: Rehabilitative and Restorative Service Providers"

## 2016-11-27 ENCOUNTER — Encounter: Payer: Self-pay | Admitting: Rehabilitative and Restorative Service Providers"

## 2016-11-27 DIAGNOSIS — M546 Pain in thoracic spine: Secondary | ICD-10-CM | POA: Diagnosis not present

## 2016-11-27 DIAGNOSIS — R29898 Other symptoms and signs involving the musculoskeletal system: Secondary | ICD-10-CM | POA: Diagnosis not present

## 2016-11-27 DIAGNOSIS — M542 Cervicalgia: Secondary | ICD-10-CM

## 2016-11-27 NOTE — Therapy (Signed)
[facility] [place] [address] [place], [place], [address] Phone: [phone]   Fax:  [phone]  Physical Therapy Evaluation  Patient Details  Name: Kristen Peterson MRN: [id number] Date of Birth: [birth year] Referring Provider: Dr Clementeen Graham  Encounter Date: 11/27/2016      PT End of Session - 11/27/16 1012    Visit Number 1   Number of Visits 12   Date for PT Re-Evaluation 01/09/17   PT Start Time 1013   PT Stop Time 1109   PT Time Calculation (min) 56 min   Activity Tolerance Patient tolerated treatment well      Past Medical History:  Diagnosis Date  . Abnormal Pap smear    ASCUS + HPV  . AMA (advanced maternal age) multigravida 35+   . Cystic fibrosis carrier   . Cystic fibrosis carrier   . GERD (gastroesophageal reflux disease)   . ONGEXBMW(413.2)     Past Surgical History:  Procedure Laterality Date  . CESAREAN SECTION  05/22/2011   Procedure: CESAREAN SECTION;  Surgeon: Zelphia Cairo, MD;  Location: [facility];  Service: Gynecology;  Laterality: N/A;  primary/edc 06/16/11  . COLPOSCOPY  2009  . NO PAST SURGERIES      There were no vitals filed for this visit.       Subjective Assessment - 11/27/16 1018    Subjective Patient reports that she has had neck pain over the past 8-9 years and she is now noticing more back pain. She does not remember an accident or injury. She does remember having a pain in the posterior cervical neck area when she looked up into a high shelf and could not look down or moe head. She has had episodic flare ups of pain creating inability to move head. She has tried chiropractic care without success.     Pertinent History denies any musculoskeletal problems    How long can you sit comfortably? comfortable sofa "not too bad"   How long can you stand comfortably? 45 min    How long can you walk comfortably? 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 Yes   Pain Score 3    Pain Location Neck   Pain Orientation Right;Left;Upper;Mid;Lower   Pain Descriptors / Indicators Aching;Nagging;Burning   Pain Type Chronic pain   Pain Radiating Towards midback and shoulders    Pain Onset More than a month ago   Pain Frequency Intermittent   Aggravating Factors  moving head; sleeping   Pain Relieving Factors certain positions; muscle relaxant             [facility] PT Assessment - 11/27/16 0001      Assessment   Medical Diagnosis Cervical and thoracic pain; LBP    Referring Provider Dr Clementeen Graham   Onset Date/Surgical Date 10/15/16  symptoms present for the past 8-9 years    Hand Dominance Right   Next MD Visit 8/18   Prior Therapy chiropractic      Precautions   Precautions None     Balance Screen   Has the patient fallen in the past 6 months No   Has the patient had a decrease in activity level because of a fear of falling?  No   Is the patient reluctant to leave their home because of a fear of falling?  No  Prior Function   Level of Independence Independent   Vocation Full time employment   Buyer, retail 5 th grade - for 17 years    Leisure household chores; wife; mom      Observation/Other Assessments   Focus on Therapeutic Outcomes (FOTO)  40% limitation      Sensation   Additional Comments WFL's per pt report   strange tingling numbness in mid back at bra line ~ monthly      Posture/Postural Control   Posture Comments head forward; shoulders rounded and elevated; increased thoracic kyphosis; scapulae abducted and rotated along the thoracic spine      AROM   Right/Left Shoulder --  end range tightness Rt > Lt w/ elevation    Cervical Flexion 68   Cervical Extension 53  pain end range    Cervical - Right Side Bend 44  tight    Cervical - Left Side Bend 38  tight   Cervical - Right Rotation 75   Cervical - Left  Rotation 60     Strength   Overall Strength Comments WNL's except mid and lower traps 5-/5      Palpation   Spinal mobility hypomobile thoracic and cervical spine with CPA mobs    Palpation comment significant muscular tightness ant/lat/post cervical; upper trap; leveator; pecs; teres Rt > Lt             Objective measurements completed on examination: See above findings.          [facility] Adult PT Treatment/Exercise - 11/27/16 0001      Neuro Re-ed    Neuro Re-ed Details  postural correction      Shoulder Exercises: Standing   Other Standing Exercises scap squeeze with noodle 10 sec x 10; W's x 5 with noodle      Shoulder Exercises: Stretch   Other Shoulder Stretches 3 way doorway stretch 30 sec x 3      Moist Heat Therapy   Number Minutes Moist Heat 20 Minutes   Moist Heat Location Cervical  thoracic     Electrical Stimulation   Electrical Stimulation Location bilat cervical and mid thoracic paraspinals    Electrical Stimulation Action IFC   Electrical Stimulation Parameters to tolerance   Electrical Stimulation Goals Pain;Tone                PT Education - 11/27/16 1051    Education provided Yes   Education Details postural correction; HEP    Person(s) Educated Patient   Methods Explanation;Demonstration;Tactile cues;Verbal cues;Handout   Comprehension Verbalized understanding;Returned demonstration;Verbal cues required;Tactile cues required             PT Long Term Goals - 11/27/16 1310      PT LONG TERM GOAL #1   Title Improve posture and alignment with patient to demonstrate good upright posture with posterior shoudler girdle engaged 01/09/17   Time 6   Period Weeks   Status New     PT LONG TERM GOAL #2   Title Increase cervical ROM by 5-10 deg in rotation and lateral flexion with no pain 01/09/17   Time 6   Period Weeks   Status New     PT LONG TERM GOAL #3   Title Improve sleep with patient reporting awakening 50-75% fewer times  each night to change positions 01/09/17   Time 6   Period Weeks   Status New     PT LONG TERM GOAL #4   Title Independent in  HEP 01/09/17   Time 6   Period Weeks   Status New     PT LONG TERM GOAL #5   Title Improve FOTO to </= 35% limitation 01/09/17   Time 6   Period Weeks   Status New                Plan - 11/27/16 1305    Clinical Impression Statement Kristen Peterson presents with flare up of cervical and thoracic pain over the past several weeks with a history of pain for the past 8-9 years including episodic flare ups interferring with work. She has poor posture and alignment; limited cervical and thoracic mobilty and ROM; muscular tightness to palpation; difficulty with functional activities and sleeping. Patient will benefit form PT to address problems identified.    History and Personal Factors relevant to plan of care: Chronic problems    Clinical Presentation Stable   Clinical Decision Making Low   Rehab Potential Good   PT Frequency 2x / week   PT Duration 6 weeks   PT Treatment/Interventions Patient/family education;ADLs/Self Care Home Management;Cryotherapy;Electrical Stimulation;Iontophoresis 4mg /ml Dexamethasone;Moist Heat;Traction;Ultrasound;Dry needling;Manual techniques;Therapeutic activities;Therapeutic exercise;Neuromuscular re-education   PT Next Visit Plan continue work on postural correction; review HEP; add snow angel prolonged stretch; trial of DN; manual work; progress to posterior shoudler girdle strengthening as tolerated; thoracic mobs; madaliites as indicated    Consulted and Agree with Plan of Care Patient      Patient will benefit from skilled therapeutic intervention in order to improve the following deficits and impairments:  Postural dysfunction, Improper body mechanics, Pain, Decreased range of motion, Decreased mobility, Increased fascial restricitons, Increased muscle spasms, Decreased activity tolerance  Visit Diagnosis: Cervicalgia - Plan: PT plan  of care cert/re-cert  Pain in thoracic spine - Plan: PT plan of care cert/re-cert  Other symptoms and signs involving the musculoskeletal system - Plan: PT plan of care cert/re-cert     Problem List Patient Active Problem List   Diagnosis Date Noted  . Cervical spine pain 11/13/2016  . Lumbago 11/13/2016  . Scoliosis 11/13/2016  . Leg length discrepancy 11/13/2016  . Irritability 11/13/2016  . GERD (gastroesophageal reflux disease) 11/13/2016    Kristen Peterson Rober MinionP Zamarion Longest PT, MPH  11/27/2016, 1:15 PM  [facility] [address] [place], [place], [address] Phone: [phone]   Fax:  [phone]  Name: Kristen Peterson MRN: [id number] Date of Birth: November 22, 1975

## 2016-11-27 NOTE — Patient Instructions (Addendum)
Axial Extension (Chin Tuck)    Pull chin in and lengthen back of neck. Hold __5-10__ seconds while counting out loud. Repeat __5-10__ times. Do _several___ sessions per day. Also lying on back    Shoulder Blade Squeeze    Rotate shoulders back, then squeeze shoulder blades down and back. Hold 10 sec Repeat _10___ times. Do _several ___ sessions per day.   Scapula Adduction With Pectoralis Stretch: Low - Standing   Shoulders at 45 hands even with shoulders, keeping weight through legs, shift weight forward until you feel pull or stretch through the front of your chest. Hold _30__ seconds. Do _3__ times, _2-4__ times per day.   Scapula Adduction With Pectoralis Stretch: Mid-Range - Standing   Shoulders at 90 elbows even with shoulders, keeping weight through legs, shift weight forward until you feel pull or strength through the front of your chest. Hold __30_ seconds. Do _3__ times, __2-4_ times per day.   Scapula Adduction With Pectoralis Stretch: High - Standing   Shoulders at 120 hands up high on the doorway, keeping weight on feet, shift weight forward until you feel pull or stretch through the front of your chest. Hold _30__ seconds. Do _3__ times, _2-3__ times per day.   Scapular Retraction: Elbow Flexion (Standing)    With elbows bent to 90, pinch shoulder blades together and rotate arms out, keeping elbows bent. Repeat __5-10__ times per set. Do __1-2__ sets per session. Do _several __ sessions per day.  Trigger Point Dry Needling  . What is Trigger Point Dry Needling (DN)? o DN is a physical therapy technique used to treat muscle pain and dysfunction. Specifically, DN helps deactivate muscle trigger points (muscle knots).  o A thin filiform needle is used to penetrate the skin and stimulate the underlying trigger point. The goal is for a local twitch response (LTR) to occur and for the trigger point to relax. No medication of any kind is injected during the  procedure.   . What Does Trigger Point Dry Needling Feel Like?  o The procedure feels different for each individual patient. Some patients report that they do not actually feel the needle enter the skin and overall the process is not painful. Very mild bleeding may occur. However, many patients feel a deep cramping in the muscle in which the needle was inserted. This is the local twitch response.   Marland Kitchen. How Will I feel after the treatment? o Soreness is normal, and the onset of soreness may not occur for a few hours. Typically this soreness does not last longer than two days.  o Bruising is uncommon, however; ice can be used to decrease any possible bruising.  o In rare cases feeling tired or nauseous after the treatment is normal. In addition, your symptoms may get worse before they get better, this period will typically not last longer than 24 hours.   . What Can I do After My Treatment? o Increase your hydration by drinking more water for the next 24 hours. o You may place ice or heat on the areas treated that have become sore, however, do not use heat on inflamed or bruised areas. Heat often brings more relief post needling. o You can continue your regular activities, but vigorous activity is not recommended initially after the treatment for 24 hours. o DN is best combined with other physical therapy such as strengthening, stretching, and other therapies.

## 2016-12-04 ENCOUNTER — Encounter: Payer: BC Managed Care – PPO | Admitting: Physical Therapy

## 2016-12-13 ENCOUNTER — Ambulatory Visit (INDEPENDENT_AMBULATORY_CARE_PROVIDER_SITE_OTHER): Payer: BC Managed Care – PPO | Admitting: Rehabilitative and Restorative Service Providers"

## 2016-12-13 ENCOUNTER — Encounter: Payer: Self-pay | Admitting: Rehabilitative and Restorative Service Providers"

## 2016-12-13 DIAGNOSIS — R29898 Other symptoms and signs involving the musculoskeletal system: Secondary | ICD-10-CM | POA: Diagnosis not present

## 2016-12-13 DIAGNOSIS — M546 Pain in thoracic spine: Secondary | ICD-10-CM | POA: Diagnosis not present

## 2016-12-13 DIAGNOSIS — M542 Cervicalgia: Secondary | ICD-10-CM | POA: Diagnosis not present

## 2016-12-13 NOTE — Therapy (Addendum)
[place] [place], [place], [address] Phone: [phone]   Fax:  [phone]  Physical Therapy Treatment  Patient Details  Name: Kristen Peterson MRN: [id number] Date of Birth: Aug 12, 1975 Referring Provider: Dr Lynne Leader   Encounter Date: 12/13/2016      PT End of Session - 12/13/16 0852    Visit Number 2   Number of Visits 12   Date for PT Re-Evaluation 01/09/17   PT Start Time 0847   PT Stop Time 0945   PT Time Calculation (min) 58 min   Activity Tolerance Patient tolerated treatment well      Past Medical History:  Diagnosis Date  . Abnormal Pap smear    ASCUS + HPV  . AMA (advanced maternal age) multigravida 69+   . Cystic fibrosis carrier   . Cystic fibrosis carrier   . GERD (gastroesophageal reflux disease)   . MNOTRRNH(657.9)     Past Surgical History:  Procedure Laterality Date  . CESAREAN SECTION  05/22/2011   Procedure: CESAREAN SECTION;  Surgeon: Marylynn Pearson, MD;  Location: [facility];  Service: Gynecology;  Laterality: N/A;  primary/edc 06/16/11  . COLPOSCOPY  2009  . NO PAST SURGERIES      There were no vitals filed for this visit.      Subjective Assessment - 12/13/16 0854    Subjective Patient reports that she had no neck pain after initial treatment for the first time in a long time - lasted about two days. Getting ready for vacation and has had less time for exercises. Wants to try DN today. She has had Rt hip pain may be related to walking and travel.    Currently in Pain? Yes   Pain Score 4    Pain Location Neck   Pain Orientation Right;Left;Upper;Mid;Lower   Pain Descriptors / Indicators Aching;Nagging;Burning            [facility] PT Assessment - 12/13/16 0001      Assessment   Medical Diagnosis Cervical and thoracic pain; LBP    Referring Provider Dr Lynne Leader    Onset Date/Surgical Date 10/15/16  symptoms present for the past 8-9 years    Hand Dominance Right    Next MD Visit 8/18   Prior Therapy chiropractic      AROM   Cervical Flexion 69   Cervical Extension 53  pain end range    Cervical - Right Side Bend 42  tight    Cervical - Left Side Bend 39  tight   Cervical - Right Rotation 75     Palpation   Spinal mobility hypomobile thoracic and cervical spine with CPA mobs    Palpation comment significant muscular tightness ant/lat/post cervical; upper trap; leveator; pecs; teres Rt > Lt                      [facility] Adult PT Treatment/Exercise - 12/13/16 0001      Shoulder Exercises: Supine   Other Supine Exercises axial extension 10 sec x 5      Shoulder Exercises: Standing   Extension Strengthening;Both;20 reps;Theraband   Theraband Level (Shoulder Extension) Level 2 (Red)   Row Strengthening;Both;20 reps;Theraband   Theraband Level (Shoulder Row) Level 2 (Red)   Retraction Strengthening;Both;20 reps;Theraband  with swim noodle    Theraband Level (Shoulder Retraction) Level 1 (Yellow)   Other Standing Exercises scap squeeze with noodle 10 sec x 10; W's x 5 with noodle  Other Standing Exercises W's x 10      Shoulder Exercises: Stretch   Other Shoulder Stretches 3 way doorway stretch 30 sec x 3      Moist Heat Therapy   Number Minutes Moist Heat 20 Minutes   Moist Heat Location Cervical  thoracic     Electrical Stimulation   Electrical Stimulation Location bilat cervical and mid thoracic paraspinals    Electrical Stimulation Action IFC   Electrical Stimulation Parameters to tolerance   Electrical Stimulation Goals Pain;Tone          Trigger Point Dry Needling - 12/13/16 0930    Consent Given? Yes   Education Handout Provided Yes   Muscles Treated Upper Body Suboccipitals muscle group;Longissimus  bilat    SubOccipitals Response Palpable increased muscle length   Longissimus Response Palpable increased muscle length  cervical               PT Education - 12/13/16 0903    Education provided Yes    Education Details HEP    Person(s) Educated Patient   Methods Explanation;Demonstration;Tactile cues;Verbal cues;Handout   Comprehension Verbalized understanding;Returned demonstration;Verbal cues required;Tactile cues required             PT Long Term Goals - 12/13/16 0853      PT LONG TERM GOAL #1   Title Improve posture and alignment with patient to demonstrate good upright posture with posterior shoudler girdle engaged 01/09/17   Time 6   Period Weeks   Status On-going     PT LONG TERM GOAL #2   Title Increase cervical ROM by 5-10 deg in rotation and lateral flexion with no pain 01/09/17   Time 6   Period Weeks   Status On-going     PT LONG TERM GOAL #3   Title Improve sleep with patient reporting awakening 50-75% fewer times each night to change positions 01/09/17   Time 6   Period Weeks   Status On-going     PT LONG TERM GOAL #4   Title Independent in HEP 01/09/17   Time 6   Period Weeks   Status On-going     PT LONG TERM GOAL #5   Title Improve FOTO to </= 35% limitation 01/09/17   Time 6   Period Weeks   Status On-going               Plan - 12/13/16 0930    Clinical Impression Statement Kaisley reports good improvement following initial treatment with no pain or tightness in neck for two days. Symptoms have gredually returned. Trial of DN tolerated only fair with only a few needles in the occipital and cervical spine area. Excellent response to manual work. Added exercises without difficulty. Patient has not accomplished goals only second visit.    Rehab Potential Good   PT Frequency 2x / week   PT Duration 6 weeks   PT Treatment/Interventions Patient/family education;ADLs/Self Care Home Management;Cryotherapy;Electrical Stimulation;Iontophoresis 76m/ml Dexamethasone;Moist Heat;Traction;Ultrasound;Dry needling;Manual techniques;Therapeutic activities;Therapeutic exercise;Neuromuscular re-education   PT Next Visit Plan continue work on postural correction;  review HEP; add snow angel prolonged stretch; assess response to DN; manual work; progress to posterior shoudler girdle strengthening as tolerated; thoracic mobs; madaliites as indicated    Consulted and Agree with Plan of Care Patient      Patient will benefit from skilled therapeutic intervention in order to improve the following deficits and impairments:  Postural dysfunction, Improper body mechanics, Pain, Decreased range of motion, Decreased mobility, Increased fascial restricitons,  Increased muscle spasms, Decreased activity tolerance  Visit Diagnosis: Cervicalgia  Pain in thoracic spine  Other symptoms and signs involving the musculoskeletal system     Problem List Patient Active Problem List   Diagnosis Date Noted  . Cervical spine pain 11/13/2016  . Lumbago 11/13/2016  . Scoliosis 11/13/2016  . Leg length discrepancy 11/13/2016  . Irritability 11/13/2016  . GERD (gastroesophageal reflux disease) 11/13/2016    Celyn Nilda Simmer PT, MPH  12/13/2016, 9:35 AM  [facility] [place], [place], [address] Phone: [phone]   Fax:  [phone]  Name: Ashanna Heinsohn MRN: [id number] Date of Birth: Aug 16, 1975  PHYSICAL THERAPY DISCHARGE SUMMARY  Visits from Start of Care: 2  Current functional level related to goals / functional outcomes: See last progress note for discharge status   Remaining deficits: Unknown    Education / Equipment: HEP Plan: Patient agrees to discharge.  Patient goals were partially met. Patient is being discharged due to not returning since the last visit.  ?????      Celyn P. Helene Kelp PT, MPH 01/26/17 2:36 PM

## 2016-12-13 NOTE — Patient Instructions (Addendum)
Resisted External Rotation: in Neutral - Bilateral   PALMS UP Sit or stand, tubing in both hands, elbows at sides, bent to 90, forearms forward. Pinch shoulder blades together and rotate forearms out. Keep elbows at sides. Repeat __10__ times per set. Do _2-3___ sets per session. Do _2-3___ sessions per day.   Low Row: Standing   Face anchor, feet shoulder width apart. Palms up, pull arms back, squeezing shoulder blades together. Repeat 10__ times per set. Do 2-3__ sets per session. Do 2-3__ sessions per week. Anchor Height: Waist     Strengthening: Resisted Extension   Hold tubing in right hand, arm forward. Pull arm back, elbow straight. Repeat _10___ times per set. Do 2-3____ sets per session. Do 2-3____ sessions per day.    Scapular Retraction: Elbow Flexion (Standing)    With elbows bent to 90, pinch shoulder blades together and rotate arms out, keeping elbows bent. Repeat __10__ times per set. Do __1__ sets per session. Do _several ___ sessions per day.    Trigger Point Dry Needling  . What is Trigger Point Dry Needling (DN)? o DN is a physical therapy technique used to treat muscle pain and dysfunction. Specifically, DN helps deactivate muscle trigger points (muscle knots).  o A thin filiform needle is used to penetrate the skin and stimulate the underlying trigger point. The goal is for a local twitch response (LTR) to occur and for the trigger point to relax. No medication of any kind is injected during the procedure.   . What Does Trigger Point Dry Needling Feel Like?  o The procedure feels different for each individual patient. Some patients report that they do not actually feel the needle enter the skin and overall the process is not painful. Very mild bleeding may occur. However, many patients feel a deep cramping in the muscle in which the needle was inserted. This is the local twitch response.   Marland Kitchen. How Will I feel after the treatment? o Soreness is normal,  and the onset of soreness may not occur for a few hours. Typically this soreness does not last longer than two days.  o Bruising is uncommon, however; ice can be used to decrease any possible bruising.  o In rare cases feeling tired or nauseous after the treatment is normal. In addition, your symptoms may get worse before they get better, this period will typically not last longer than 24 hours.   . What Can I do After My Treatment? o Increase your hydration by drinking more water for the next 24 hours. o You may place ice or heat on the areas treated that have become sore, however, do not use heat on inflamed or bruised areas. Heat often brings more relief post needling. o You can continue your regular activities, but vigorous activity is not recommended initially after the treatment for 24 hours. o DN is best combined with other physical therapy such as strengthening, stretching, and other therapies.

## 2017-03-26 ENCOUNTER — Encounter: Payer: Self-pay | Admitting: Family Medicine

## 2017-03-26 ENCOUNTER — Ambulatory Visit (INDEPENDENT_AMBULATORY_CARE_PROVIDER_SITE_OTHER): Payer: BC Managed Care – PPO | Admitting: Family Medicine

## 2017-03-26 VITALS — BP 98/44 | HR 72 | Temp 98.1°F | Resp 16 | Ht 67.0 in | Wt 131.1 lb

## 2017-03-26 DIAGNOSIS — J40 Bronchitis, not specified as acute or chronic: Secondary | ICD-10-CM | POA: Diagnosis not present

## 2017-03-26 MED ORDER — BENZONATATE 200 MG PO CAPS: 200.0000 mg | ORAL_CAPSULE | Freq: Three times a day (TID) | ORAL | 0 refills | Status: DC | PRN

## 2017-03-26 MED ORDER — AZITHROMYCIN 250 MG PO TABS: 250.0000 mg | ORAL_TABLET | Freq: Every day | ORAL | 0 refills | Status: DC

## 2017-03-26 MED ORDER — GUAIFENESIN-CODEINE 100-10 MG/5ML PO SOLN: 5.0000 mL | Freq: Every evening | ORAL | 0 refills | Status: DC | PRN

## 2017-03-26 MED ORDER — PREDNISONE 10 MG PO TABS: 30.0000 mg | ORAL_TABLET | Freq: Every day | ORAL | 0 refills | Status: DC

## 2017-03-26 NOTE — Patient Instructions (Signed)
Thank you for coming in today. Take prednisone and azithromycin.  Use tessalon for cough during the day and codeine at night.  Recheck as needed.  Also consider sugar free cough drops that contain menthol.   Call or go to the emergency room if you get worse, have trouble breathing, have chest pains, or palpitations.    Acute Bronchitis, Adult Acute bronchitis is when air tubes (bronchi) in the lungs suddenly get swollen. The condition can make it hard to breathe. It can also cause these symptoms:  A cough.  Coughing up clear, yellow, or green mucus.  Wheezing.  Chest congestion.  Shortness of breath.  A fever.  Body aches.  Chills.  A sore throat.  Follow these instructions at home: Medicines  Take over-the-counter and prescription medicines only as told by your doctor.  If you were prescribed an antibiotic medicine, take it as told by your doctor. Do not stop taking the antibiotic even if you start to feel better. General instructions  Rest.  Drink enough fluids to keep your pee (urine) clear or pale yellow.  Avoid smoking and secondhand smoke. If you smoke and you need help quitting, ask your doctor. Quitting will help your lungs heal faster.  Use an inhaler, cool mist vaporizer, or humidifier as told by your doctor.  Keep all follow-up visits as told by your doctor. This is important. How is this prevented? To lower your risk of getting this condition again:  Wash your hands often with soap and water. If you cannot use soap and water, use hand sanitizer.  Avoid contact with people who have cold symptoms.  Try not to touch your hands to your mouth, nose, or eyes.  Make sure to get the flu shot every year.  Contact a doctor if:  Your symptoms do not get better in 2 weeks. Get help right away if:  You cough up blood.  You have chest pain.  You have very bad shortness of breath.  You become dehydrated.  You faint (pass out) or keep feeling like  you are going to pass out.  You keep throwing up (vomiting).  You have a very bad headache.  Your fever or chills gets worse. This information is not intended to replace advice given to you by your health care provider. Make sure you discuss any questions you have with your health care provider. Document Released: 10/11/2007 Document Revised: 12/01/2015 Document Reviewed: 10/13/2015 Elsevier Interactive Patient Education  2017 [employer]

## 2017-03-26 NOTE — Progress Notes (Signed)
Elina Boone [name] is a 41 y.o. female who [facility] today for cough congestion wheezing and chest tightness. Symptoms present for a little over a week. Her son is sick with a similar illness. She states over over-the-counter medications which have been only mildly helpful. The cough is obnoxious and interferes with sleep. She denies severe shortness of breath vomiting chest pain or diarrhea.   Past Medical History:  Diagnosis Date  . Abnormal Pap smear    ASCUS + HPV  . AMA (advanced maternal age) multigravida 35+   . Cystic fibrosis carrier   . Cystic fibrosis carrier   . GERD (gastroesophageal reflux disease)   . ZHYQMVHQ(469.6Headache(784.0)    Past Surgical History:  Procedure Laterality Date  . CESAREAN SECTION N/A 05/22/2011   Performed by Zelphia CairoAdkins, Gretchen[facility]  . COLPOSCOPY  2009  . NO PAST SURGERIES     Social History   Tobacco Use  . Smoking status: Never Smoker  . Smokeless tobacco: Never Used  Substance Use Topics  . Alcohol use: Yes   family history includes Cancer in her maternal grandmother; Diabetes in her maternal grandfather and maternal grandmother; Diverticulitis in her father; Hyperlipidemia in her father; Hypertension in her father, maternal grandfather, and maternal grandmother; Osteoporosis in her maternal grandmother and mother; Thyroid disease in her maternal grandmother and mother.  ROS as above:  Medications: Current Outpatient Medications  Medication Sig Dispense Refill  . azithromycin (ZITHROMAX) 250 MG tablet Take 1 tablet (250 mg total) daily by mouth. Take first 2 tablets together, then 1 every day until finished. 6 tablet 0  . benzonatate (TESSALON) 200 MG capsule Take 1 capsule (200 mg total) 3 (three) times daily as needed by mouth for cough. 45 capsule 0  . cyclobenzaprine (FLEXERIL) 5 MG tablet Take 1-2 tablets (5-10  mg total) by mouth at bedtime as needed for muscle spasms. 90 tablet 1  . escitalopram (LEXAPRO) 10 MG tablet Take 10 mg by mouth daily.    Marland Kitchen. guaiFENesin-codeine 100-10 MG/5ML syrup Take 5 mLs at bedtime as needed by mouth for cough. 180 mL 0  . ibuprofen (ADVIL,MOTRIN) 200 MG tablet Take 200 mg by mouth every 6 (six) hours as needed.    . pantoprazole (PROTONIX) 40 MG tablet Take 1 tablet (40 mg total) by mouth daily. 90 tablet 3  . predniSONE (DELTASONE) 10 MG tablet Take 3 tablets (30 mg total) daily with breakfast by mouth. 15 tablet 0   No current facility-administered medications for this visit.    No Known Allergies  Health Maintenance Health Maintenance  Topic Date Due  . INFLUENZA VACCINE  12/06/2016  . TETANUS/TDAP  11/21/2017 (Originally 01/01/1994)  . MAMMOGRAM  12/07/2017  . PAP SMEAR  12/07/2018  . HIV Screening  Completed     Exam:  BP (!) 98/44   Pulse 72   Temp 98.1 F (36.7 C) (Oral)   Resp 16   Ht 5\' 7"  (1.702 m)   Wt 131 lb 1.3 oz (59.5 kg)   SpO2 98%   BMI 20.53 kg/m  Gen: Well NAD HEENT: EOMI,  MMM clear nasal discharge. Lungs: Normal work of breathing. Wheezing present bilaterally. Heart: RRR no MRG Abd: NABS, Soft. Nondistended, Nontender Exts: Brisk capillary refill, warm and well perfused.    No results found for this or any previous visit (from the past 72 hour(s)). No results found.    Assessment and  Plan: 41 y.o. female with cough likely viral bronchitis. She may be experiencing a reactive airway disease component and may also be developing second sickening. Plan to treat with prednisone codeine cough syrup Tessalon Perles and azithromycin as a backup if not better. Recheck if not improving.   No orders of the defined types were placed in this encounter.  Meds ordered this encounter  Medications  . predniSONE (DELTASONE) 10 MG tablet    Sig: Take 3 tablets (30 mg total) daily with breakfast by mouth.    Dispense:  15 tablet     Refill:  0  . azithromycin (ZITHROMAX) 250 MG tablet    Sig: Take 1 tablet (250 mg total) daily by mouth. Take first 2 tablets together, then 1 every day until finished.    Dispense:  6 tablet    Refill:  0  . guaiFENesin-codeine 100-10 MG/5ML syrup    Sig: Take 5 mLs at bedtime as needed by mouth for cough.    Dispense:  180 mL    Refill:  0  . benzonatate (TESSALON) 200 MG capsule    Sig: Take 1 capsule (200 mg total) 3 (three) times daily as needed by mouth for cough.    Dispense:  45 capsule    Refill:  0     Discussed warning signs or symptoms. Please see discharge instructions. Patient expresses understanding.

## 2017-05-30 ENCOUNTER — Other Ambulatory Visit: Payer: Self-pay | Admitting: Family Medicine

## 2017-06-15 ENCOUNTER — Other Ambulatory Visit: Payer: Self-pay

## 2017-06-15 MED ORDER — IBUPROFEN 800 MG PO TABS: 800.0000 mg | ORAL_TABLET | Freq: Three times a day (TID) | ORAL | 3 refills | Status: DC | PRN

## 2017-06-15 NOTE — Telephone Encounter (Signed)
Patient request a 90 day supply of Ibuprofen 800 mg. #[phone] [facility]. Vernon Ariel,CMA

## 2017-08-25 ENCOUNTER — Encounter: Payer: Self-pay | Admitting: Emergency Medicine

## 2017-08-25 ENCOUNTER — Emergency Department
Admission: EM | Admit: 2017-08-25 | Discharge: 2017-08-25 | Disposition: A | Discharge status: Home / Self Care | Payer: BC Managed Care – PPO | Source: Home / Self Care | Attending: Family Medicine | Admitting: Family Medicine

## 2017-08-25 DIAGNOSIS — J029 Acute pharyngitis, unspecified: Secondary | ICD-10-CM | POA: Diagnosis not present

## 2017-08-25 NOTE — ED Triage Notes (Signed)
[facility] sore throat nasal congestion and sinus drainage times two days denies fever.

## 2017-08-25 NOTE — ED Provider Notes (Signed)
[facility]    CSN: [id number] Arrival date & time: 08/25/17  1053     History   Chief Complaint Chief Complaint  Patient presents with  . Sore Throat    HPI Kristen Peterson is a 42 y.o. female.   Patient complains of sore throat for two days.  Yesterday she became increasingly fatigued, and developed sinus congestion, headache, and fatigue.  No fevers, chills, and sweats.  The history is provided by the patient.    Past Medical History:  Diagnosis Date  . Abnormal Pap smear    ASCUS + HPV  . AMA (advanced maternal age) multigravida 35+   . Cystic fibrosis carrier   . Cystic fibrosis carrier   . GERD (gastroesophageal reflux disease)   . [id number](478.2)     Patient Active Problem List   Diagnosis Date Noted  . Cervical spine pain 11/13/2016  . Lumbago 11/13/2016  . Scoliosis 11/13/2016  . Leg length discrepancy 11/13/2016  . Irritability 11/13/2016  . GERD (gastroesophageal reflux disease) 11/13/2016    Past Surgical History:  Procedure Laterality Date  . CESAREAN SECTION  05/22/2011   Procedure: CESAREAN SECTION;  Surgeon: Zelphia Cairo, MD;  Location: [facility];  Service: Gynecology;  Laterality: N/A;  primary/edc 06/16/11  . COLPOSCOPY  2009  . NO PAST SURGERIES      OB History    Gravida  3   Para  2   Term  1   Preterm  1   AB  1   Living  2     SAB  1   TAB  0   Ectopic  0   Multiple  0   Live Births  2            Home Medications    Prior to Admission medications   Medication Sig Start Date End Date Taking? Authorizing Provider  cyclobenzaprine (FLEXERIL) 5 MG tablet Take 1-2 tablets (5-10 mg total) by mouth at bedtime as needed for muscle spasms. 11/13/16   Rodolph Bong, MD  escitalopram (LEXAPRO) 10 MG tablet Take 10 mg by mouth daily. 11/09/16   [provider]  guaiFENesin-codeine 100-10 MG/5ML syrup Take 5 mLs at bedtime as needed by mouth for cough. 03/26/17   Rodolph Bong, MD  ibuprofen  (ADVIL,MOTRIN) 200 MG tablet Take 200 mg by mouth every 6 (six) hours as needed.    [provider]  ibuprofen (ADVIL,MOTRIN) 800 MG tablet Take 1 tablet (800 mg total) by mouth every 8 (eight) hours as needed. Take with pepcid or zantac 06/15/17   Rodolph Bong, MD  pantoprazole (PROTONIX) 40 MG tablet Take 1 tablet (40 mg total) by mouth daily. 11/13/16   Rodolph Bong, MD  predniSONE (DELTASONE) 10 MG tablet Take 3 tablets (30 mg total) daily with breakfast by mouth. 03/26/17   Rodolph Bong, MD    Family History Family History  Problem Relation Age of Onset  . Osteoporosis Mother   . Thyroid disease Mother   . Hypertension Father   . Hyperlipidemia Father   . Diverticulitis Father   . Hypertension Maternal Grandmother   . Cancer Maternal Grandmother        breast  . Osteoporosis Maternal Grandmother   . Thyroid disease Maternal Grandmother   . Diabetes Maternal Grandmother   . Diabetes Maternal Grandfather   . Hypertension Maternal Grandfather     Social History Social History   Tobacco Use  . Smoking status: Never Smoker  .  Smokeless tobacco: Never Used  Substance Use Topics  . Alcohol use: Yes  . Drug use: No     Allergies   Patient has no known allergies.   Review of Systems Review of Systems + sore throat + cough No pleuritic pain No wheezing + nasal congestion + post-nasal drainage No sinus pain/pressure No itchy/red eyes No earache No hemoptysis No SOB No fever/chills No nausea No vomiting No abdominal pain No diarrhea No urinary symptoms No skin rash + fatigue + myalgias + headache Used OTC meds without relief   Physical Exam Triage Vital Signs ED Triage Vitals  Enc Vitals Group     BP 08/25/17 1126 123/85     Pulse Rate 08/25/17 1126 83     Resp 08/25/17 1126 16     Temp 08/25/17 1126 98.3 F (36.8 C)     Temp Source 08/25/17 1126 Oral     SpO2 08/25/17 1126 99 %     Weight 08/25/17 1127 133 lb 8 oz (60.6 kg)     Height  08/25/17 1127 5\' 7"  (1.702 m)     Head Circumference --      Peak Flow --      Pain Score 08/25/17 1126 6     Pain Loc --      Pain Edu? --      Excl. in GC? --    No data found.  Updated Vital Signs BP 123/85 (BP Location: Right Arm)   Pulse 83   Temp 98.3 F (36.8 C) (Oral)   Resp 16   Ht 5\' 7"  (1.702 m)   Wt 133 lb 8 oz (60.6 kg)   LMP 07/30/2017   SpO2 99%   BMI 20.91 kg/m   Visual Acuity Right Eye Distance:   Left Eye Distance:   Bilateral Distance:    Right Eye Near:   Left Eye Near:    Bilateral Near:     Physical Exam Nursing notes and Vital Signs reviewed. Appearance:  Patient appears stated age, and in no acute distress Eyes:  Pupils are equal, round, and reactive to light and accomodation.  Extraocular movement is intact.  Conjunctivae are not inflamed  Ears:  Canals normal.  Tympanic membranes normal.  Nose:  Mildly congested turbinates.  No sinus tenderness.  Pharynx:  Mildly erythematous Neck:  Supple.  Tonsillar nodes are tender to palpation bilaterally.  Enlarged posterior/lateral nodes are palpated bilaterally, tender to palpation on the left.   Lungs:  Clear to auscultation.  Breath sounds are equal.  Moving air well. Heart:  Regular rate and rhythm without murmurs, rubs, or gallops.  Abdomen:  Nontender without masses or hepatosplenomegaly.  Bowel sounds are present.  No CVA or flank tenderness.  Extremities:  No edema.  Skin:  No rash present.    UC Treatments / Results  Labs (all labs ordered are listed, but only abnormal results are displayed) Labs Reviewed  CULTURE, GROUP A STREP [facility])  POCT RAPID STREP A (OFFICE) negative    EKG None Radiology No results found.  Procedures Procedures (including critical care time)  Medications Ordered in UC Medications - No data to display   Initial Impression / Assessment and Plan / UC Course  I have reviewed the triage vital signs and the nursing notes.  Pertinent labs & imaging results  that were available during my care of the patient were reviewed by me and considered in my medical decision making (see chart for details).    Throat culture  pending. CENTOR 2.  Will send throat culture. Suspect viral URI with sore throat. As cold symptoms develop, try the following: Take plain guaifenesin (1200mg  extended release tabs such as Mucinex) twice daily, with plenty of water, for cough and congestion.  May add Pseudoephedrine (30mg , one or two every 4 to 6 hours) for sinus congestion.  Get adequate rest.   May use Afrin nasal spray (or generic oxymetazoline) each morning for about 5 days and then discontinue.  Also recommend using saline nasal spray several times daily and saline nasal irrigation (AYR is a common brand).  Use Flonase nasal spray each morning after using Afrin nasal spray and saline nasal irrigation. Try warm salt water gargles for sore throat.  Stop all antihistamines for now, and other non-prescription cough/cold preparations. May take Ibuprofen 500mg  every 8 hours with food for sore throat, body aches, etc. May take Delsym Cough Suppressant at bedtime for nighttime cough.  Followup with Family Doctor if not improved in one week.     Final Clinical Impressions(s) / UC Diagnoses   Final diagnoses:  Pharyngitis, unspecified etiology    ED Discharge Orders    None          Lattie HawBeese, Stephen A, MD 09/02/17 1108

## 2017-08-25 NOTE — Discharge Instructions (Addendum)
As cold symptoms develop, try the following: Take plain guaifenesin (1200mg  extended release tabs such as Mucinex) twice daily, with plenty of water, for cough and congestion.  May add Pseudoephedrine (30mg , one or two every 4 to 6 hours) for sinus congestion.  Get adequate rest.   May use Afrin nasal spray (or generic oxymetazoline) each morning for about 5 days and then discontinue.  Also recommend using saline nasal spray several times daily and saline nasal irrigation (AYR is a common brand).  Use Flonase nasal spray each morning after using Afrin nasal spray and saline nasal irrigation. Try warm salt water gargles for sore throat.  Stop all antihistamines for now, and other non-prescription cough/cold preparations. May take Ibuprofen 500mg  every 8 hours with food for sore throat, body aches, etc. May take Delsym Cough Suppressant at bedtime for nighttime cough.

## 2017-08-26 LAB — CULTURE, GROUP A STREP
MICRO NUMBER: 90487695
SPECIMEN QUALITY: ADEQUATE

## 2017-08-27 ENCOUNTER — Telehealth: Payer: Self-pay | Admitting: *Deleted

## 2017-08-27 MED ORDER — PENICILLIN V POTASSIUM 500 MG PO TABS: 500.0000 mg | ORAL_TABLET | Freq: Two times a day (BID) | ORAL | 0 refills | Status: DC

## 2017-08-27 NOTE — Telephone Encounter (Signed)
LMOM for patient to return call to office.   Per Dr. Rolla PlateBeese's instructions, I sent  In Penicillin VK 500mg  #20 1 twice daily for 10 days for + strep culture. [address] [facility] [address] [place]

## 2017-08-27 NOTE — Telephone Encounter (Signed)
Patient returned call, left voicemail, I did not get answer when calling back. [facility] [address] [place]. TCX +.

## 2017-09-04 ENCOUNTER — Telehealth: Payer: Self-pay

## 2017-09-04 NOTE — Telephone Encounter (Signed)
Pt called stating that she feels like she has a sinus infection. Pt unsure if she should make an appt because she is still taking the penicillin she was given last week for strep throat. Pt states she is taking penicillin BID and has about 4 more days left.   Current sinus SX are runny/plugged up nose, facial/sinus pain, and lots of mucus. Denies cough.   Please advise on what pt should do. She does not want to make appt if she will not be able to get meds to help current SX.

## 2017-09-04 NOTE — Telephone Encounter (Signed)
Bacterial sinus infection is doubtful. Penicillin works well for strep but not for bacteria that commonly cause sinus infections.  I recommend using OTC allergy medicine like Zyrtec with Decongestant (ie Zyrtec-D) and medicine like tylenol.  If not getting better make an appointment with me for further evaluation.

## 2017-09-05 ENCOUNTER — Ambulatory Visit: Payer: BC Managed Care – PPO | Admitting: Family Medicine

## 2017-09-05 ENCOUNTER — Encounter: Payer: Self-pay | Admitting: Family Medicine

## 2017-09-05 VITALS — BP 130/60 | HR 80 | Ht 67.0 in | Wt 130.0 lb

## 2017-09-05 DIAGNOSIS — J0100 Acute maxillary sinusitis, unspecified: Secondary | ICD-10-CM | POA: Diagnosis not present

## 2017-09-05 MED ORDER — CEFDINIR 300 MG PO CAPS: 300.0000 mg | ORAL_CAPSULE | Freq: Two times a day (BID) | ORAL | 0 refills | Status: DC

## 2017-09-05 MED ORDER — PREDNISONE 10 MG PO TABS: 30.0000 mg | ORAL_TABLET | Freq: Every day | ORAL | 0 refills | Status: DC

## 2017-09-05 NOTE — Progress Notes (Signed)
Kristen Peterson is a 42 y.o. female who presents to [facility]: Primary Care Sports Medicine today for sinus pain and pressure congestion body aches.  Symptoms present for several days.  Patient was seen on the 20th in urgent care and was diagnosed with strep throat based on her throat culture.  She was treated with penicillin and rapidly improved.  She continues to penicillin currently.  She notes that she had a period of time from resolution of symptoms from strep throat to the new onset of symptoms with the current illness.  She denies any fevers or chills vomiting or diarrhea currently.  She is tried over-the-counter medications which have not been helpful.  She notes her right sinus is much more painful with increased pressure compared to her left.   Past Medical History:  Diagnosis Date  . Abnormal Pap smear    ASCUS + HPV  . AMA (advanced maternal age) multigravida 35+   . Cystic fibrosis carrier   . Cystic fibrosis carrier   . GERD (gastroesophageal reflux disease)   . ZOXWRUEA(540.9)    Past Surgical History:  Procedure Laterality Date  . CESAREAN SECTION  05/22/2011   Procedure: CESAREAN SECTION;  Surgeon: Zelphia Cairo, MD;  Location: [facility];  Service: Gynecology;  Laterality: N/A;  primary/edc 06/16/11  . COLPOSCOPY  2009  . NO PAST SURGERIES     Social History   Tobacco Use  . Smoking status: Never Smoker  . Smokeless tobacco: Never Used  Substance Use Topics  . Alcohol use: Yes   family history includes Cancer in her maternal grandmother; Diabetes in her maternal grandfather and maternal grandmother; Diverticulitis in her father; Hyperlipidemia in her father; Hypertension in her father, maternal grandfather, and maternal grandmother; Osteoporosis in her maternal grandmother and mother; Thyroid disease in her maternal grandmother and mother.  ROS as above:  Medications: Current  Outpatient Medications  Medication Sig Dispense Refill  . cyclobenzaprine (FLEXERIL) 5 MG tablet Take 1-2 tablets (5-10 mg total) by mouth at bedtime as needed for muscle spasms. 90 tablet 1  . escitalopram (LEXAPRO) 10 MG tablet Take 10 mg by mouth daily.    Marland Kitchen ibuprofen (ADVIL,MOTRIN) 200 MG tablet Take 200 mg by mouth every 6 (six) hours as needed.    Marland Kitchen ibuprofen (ADVIL,MOTRIN) 800 MG tablet Take 1 tablet (800 mg total) by mouth every 8 (eight) hours as needed. Take with pepcid or zantac 270 tablet 3  . pantoprazole (PROTONIX) 40 MG tablet Take 1 tablet (40 mg total) by mouth daily. 90 tablet 3  . cefdinir (OMNICEF) 300 MG capsule Take 1 capsule (300 mg total) by mouth 2 (two) times daily. 14 capsule 0  . predniSONE (DELTASONE) 10 MG tablet Take 3 tablets (30 mg total) by mouth daily with breakfast. 15 tablet 0   No current facility-administered medications for this visit.    No Known Allergies  Health Maintenance Health Maintenance  Topic Date Due  . TETANUS/TDAP  11/21/2017 (Originally 01/01/1994)  . INFLUENZA VACCINE  12/06/2017  . MAMMOGRAM  12/07/2017  . PAP SMEAR  12/07/2018  . HIV Screening  Completed     Exam:  BP 130/60   Pulse 80   Ht  (1.702 m)   Wt 130 lb (59 kg)   BMI 20.36 kg/m  Gen: Well NAD HEENT: EOMI,  MMM tender palpation bilateral maxillary sinuses right worse than left.  Clear nasal discharge.  Inflamed nasal turbinates bilaterally.  Mild cervical  lymphadenopathy bilaterally.  Posterior pharynx normal. Lungs: Normal work of breathing. CTABL Heart: RRR no MRG Abd: NABS, Soft. Nondistended, Nontender Exts: Brisk capillary refill, warm and well perfused.    No results found for this or any previous visit (from the past 72 hour(s)). No results found.    Assessment and Plan: 42 y.o. female with sinusitis likely viral developing into bacterial now a second sickening or worsening symptoms.  Plan to treat with prednisone and Omnicef.  Discontinue  penicillin.  Continue over-the-counter medications as needed.  Recheck in the near future if not better.   No orders of the defined types were placed in this encounter.  Meds ordered this encounter  Medications  . cefdinir (OMNICEF) 300 MG capsule    Sig: Take 1 capsule (300 mg total) by mouth 2 (two) times daily.    Dispense:  14 capsule    Refill:  0  . predniSONE (DELTASONE) 10 MG tablet    Sig: Take 3 tablets (30 mg total) by mouth daily with breakfast.    Dispense:  15 tablet    Refill:  0     Discussed warning signs or symptoms. Please see discharge instructions. Patient expresses understanding.

## 2017-09-05 NOTE — Telephone Encounter (Signed)
Patient advised and scheduled.  

## 2017-09-05 NOTE — Patient Instructions (Addendum)
Thank you for coming in today. Take the omnicef twice daily for 1 week.  Take prednisone daily for 5 days.  STOP penicillin.  Recheck with me as needed.  Let me know if you need cough suppression medicine or albuterol.   Continue home PT exercise.  Consider back yoga or piliates   Sinusitis, Adult Sinusitis is soreness and inflammation of your sinuses. Sinuses are hollow spaces in the bones around your face. Your sinuses are located:  Around your eyes.  In the middle of your forehead.  Behind your nose.  In your cheekbones.  Your sinuses and nasal passages are lined with a stringy fluid (mucus). Mucus normally drains out of your sinuses. When your nasal tissues become inflamed or swollen, the mucus can become trapped or blocked so air cannot flow through your sinuses. This allows bacteria, viruses, and funguses to grow, which leads to infection. Sinusitis can develop quickly and last for 7?10 days (acute) or for more than 12 weeks (chronic). Sinusitis often develops after a cold. What are the causes? This condition is caused by anything that creates swelling in the sinuses or stops mucus from draining, including:  Allergies.  Asthma.  Bacterial or viral infection.  Abnormally shaped bones between the nasal passages.  Nasal growths that contain mucus (nasal polyps).  Narrow sinus openings.  Pollutants, such as chemicals or irritants in the air.  A foreign object stuck in the nose.  A fungal infection. This is rare.  What increases the risk? The following factors may make you more likely to develop this condition:  Having allergies or asthma.  Having had a recent cold or respiratory tract infection.  Having structural deformities or blockages in your nose or sinuses.  Having a weak immune system.  Doing a lot of swimming or diving.  Overusing nasal sprays.  Smoking.  What are the signs or symptoms? The main symptoms of this condition are pain and a feeling  of pressure around the affected sinuses. Other symptoms include:  Upper toothache.  Earache.  Headache.  Bad breath.  Decreased sense of smell and taste.  A cough that may get worse at night.  Fatigue.  Fever.  Thick drainage from your nose. The drainage is often green and it may contain pus (purulent).  Stuffy nose or congestion.  Postnasal drip. This is when extra mucus collects in the throat or back of the nose.  Swelling and warmth over the affected sinuses.  Sore throat.  Sensitivity to light.  How is this diagnosed? This condition is diagnosed based on symptoms, a medical history, and a physical exam. To find out if your condition is acute or chronic, your health care provider may:  Look in your nose for signs of nasal polyps.  Tap over the affected sinus to check for signs of infection.  View the inside of your sinuses using an imaging device that has a light attached (endoscope).  If your health care provider suspects that you have chronic sinusitis, you may also:  Be tested for allergies.  Have a sample of mucus taken from your nose (nasal culture) and checked for bacteria.  Have a mucus sample examined to see if your sinusitis is related to an allergy.  If your sinusitis does not respond to treatment and it lasts longer than 8 weeks, you may have an MRI or CT scan to check your sinuses. These scans also help to determine how severe your infection is. In rare cases, a bone biopsy may be done  to rule out more serious types of fungal sinus disease. How is this treated? Treatment for sinusitis depends on the cause and whether your condition is chronic or acute. If a virus is causing your sinusitis, your symptoms will go away on their own within 10 days. You may be given medicines to relieve your symptoms, including:  Topical nasal decongestants. They shrink swollen nasal passages and let mucus drain from your sinuses.  Antihistamines. These drugs block  inflammation that is triggered by allergies. This can help to ease swelling in your nose and sinuses.  Topical nasal corticosteroids. These are nasal sprays that ease inflammation and swelling in your nose and sinuses.  Nasal saline washes. These rinses can help to get rid of thick mucus in your nose.  If your condition is caused by bacteria, you will be given an antibiotic medicine. If your condition is caused by a fungus, you will be given an antifungal medicine. Surgery may be needed to correct underlying conditions, such as narrow nasal passages. Surgery may also be needed to remove polyps. Follow these instructions at home: Medicines  Take, use, or apply over-the-counter and prescription medicines only as told by your health care provider. These may include nasal sprays.  If you were prescribed an antibiotic medicine, take it as told by your health care provider. Do not stop taking the antibiotic even if you start to feel better. Hydrate and Humidify  Drink enough water to keep your urine clear or pale yellow. Staying hydrated will help to thin your mucus.  Use a cool mist humidifier to keep the humidity level in your home above 50%.  Inhale steam for 10-15 minutes, 3-4 times a day or as told by your health care provider. You can do this in the bathroom while a hot shower is running.  Limit your exposure to cool or dry air. Rest  Rest as much as possible.  Sleep with your head raised (elevated).  Make sure to get enough sleep each night. General instructions  Apply a warm, moist washcloth to your face 3-4 times a day or as told by your health care provider. This will help with discomfort.  Wash your hands often with soap and water to reduce your exposure to viruses and other germs. If soap and water are not available, use hand sanitizer.  Do not smoke. Avoid being around people who are smoking (secondhand smoke).  Keep all follow-up visits as told by your health care  provider. This is important. Contact a health care provider if:  You have a fever.  Your symptoms get worse.  Your symptoms do not improve within 10 days. Get help right away if:  You have a severe headache.  You have persistent vomiting.  You have pain or swelling around your face or eyes.  You have vision problems.  You develop confusion.  Your neck is stiff.  You have trouble breathing. This information is not intended to replace advice given to you by your health care provider. Make sure you discuss any questions you have with your health care provider. Document Released: 04/24/2005 Document Revised: 12/19/2015 Document Reviewed: 02/17/2015 Elsevier Interactive Patient Education  [employer].

## 2017-09-07 LAB — POCT RAPID STREP A (OFFICE): Rapid Strep A Screen: NEGATIVE

## 2017-11-09 ENCOUNTER — Other Ambulatory Visit: Payer: Self-pay | Admitting: Family Medicine

## 2017-11-09 DIAGNOSIS — K219 Gastro-esophageal reflux disease without esophagitis: Secondary | ICD-10-CM

## 2017-11-12 ENCOUNTER — Other Ambulatory Visit: Payer: Self-pay | Admitting: *Deleted

## 2018-03-11 ENCOUNTER — Ambulatory Visit (INDEPENDENT_AMBULATORY_CARE_PROVIDER_SITE_OTHER): Payer: BC Managed Care – PPO | Admitting: Family Medicine

## 2018-03-11 DIAGNOSIS — Z23 Encounter for immunization: Secondary | ICD-10-CM

## 2018-04-01 ENCOUNTER — Ambulatory Visit (INDEPENDENT_AMBULATORY_CARE_PROVIDER_SITE_OTHER): Payer: BC Managed Care – PPO | Admitting: Osteopathic Medicine

## 2018-04-01 ENCOUNTER — Encounter: Payer: Self-pay | Admitting: Osteopathic Medicine

## 2018-04-01 VITALS — BP 113/56 | HR 80 | Temp 98.0°F | Wt 136.6 lb

## 2018-04-01 DIAGNOSIS — J0100 Acute maxillary sinusitis, unspecified: Secondary | ICD-10-CM | POA: Diagnosis not present

## 2018-04-01 MED ORDER — IPRATROPIUM BROMIDE 0.06 % NA SOLN: 2.0000 | Freq: Four times a day (QID) | NASAL | 1 refills | Status: DC

## 2018-04-01 MED ORDER — AMOXICILLIN-POT CLAVULANATE 875-125 MG PO TABS: 1.0000 | ORAL_TABLET | Freq: Two times a day (BID) | ORAL | 0 refills | Status: DC

## 2018-04-01 MED ORDER — AMOXICILLIN-POT CLAVULANATE 875-125 MG PO TABS: 1.0000 | ORAL_TABLET | Freq: Two times a day (BID) | ORAL | 0 refills | Status: AC

## 2018-04-01 NOTE — Progress Notes (Signed)
HPI: Kristen Peterson is a 42 y.o. female who  has a past medical history of Abnormal Pap smear, AMA (advanced maternal age) multigravida 35+, Cystic fibrosis carrier, Cystic fibrosis carrier, GERD (gastroesophageal reflux disease), and Headache(784.0).  she presents to [facility] today, 04/01/18,  for chief complaint of: Sick  Last 2 days sinus pressure has been unbearable, increased drainage from nose, into throat, mild sore throat. Minimal cough. Overall feeling sick about 2 weeks.     Past medical, surgical, social and family history reviewed and updated as necessary.   Current medication list and allergy/intolerance information reviewed:    Current Outpatient Medications  Medication Sig Dispense Refill  . cyclobenzaprine (FLEXERIL) 5 MG tablet Take 1-2 tablets (5-10 mg total) by mouth at bedtime as needed for muscle spasms. 90 tablet 1  . escitalopram (LEXAPRO) 10 MG tablet Take 10 mg by mouth daily.    Marland Kitchen ibuprofen (ADVIL,MOTRIN) 200 MG tablet Take 200 mg by mouth every 6 (six) hours as needed.    Marland Kitchen ibuprofen (ADVIL,MOTRIN) 800 MG tablet Take 1 tablet (800 mg total) by mouth every 8 (eight) hours as needed. Take with pepcid or zantac 270 tablet 3  . pantoprazole (PROTONIX) 40 MG tablet TAKE 1 TABLET BY MOUTH ONCE DAILY 90 tablet 3   No current facility-administered medications for this visit.     No Known Allergies    Review of Systems:  Constitutional:  No  fever, no chills, +recent illness, No unintentional weight changes. +significant fatigue.   HEENT: No  headache, no vision change, no hearing change, +sore throat, +sinus pressure  Cardiac: No  chest pain, No  pressure, No palpitations  Respiratory:  No  shortness of breath. +Cough  Gastrointestinal: No  abdominal pain, No  nausea, No  vomiting,  No  blood in stool, No  diarrhea  Musculoskeletal: No new myalgia/arthralgia  Skin: No  Rash  Neurologic: No  weakness, No   dizziness  Exam:  BP (!) 113/56 (BP Location: Left Arm, Patient Position: Sitting, Cuff Size: Normal)   Pulse 80   Temp 98 F (36.7 C) (Oral)   Wt 136 lb 9.6 oz (62 kg)   SpO2 99%   BMI 21.39 kg/m   Constitutional: VS see above. General Appearance: alert, well-developed, well-nourished, NAD  Eyes: Normal lids and conjunctive, non-icteric sclera  Ears, Nose, Mouth, Throat: MMM, Normal external inspection ears/nares/mouth/lips/gums. TM normal bilaterally w/ scant clear effusion BL. Pharynx/tonsils no erythema, no exudate. Nasal mucosa normal.   Neck: No masses, trachea midline. No tenderness/mass appreciated. No lymphadenopathy  Respiratory: Normal respiratory effort. no wheeze, no rhonchi, no rales  Cardiovascular: S1/S2 normal, no murmur, no rub/gallop auscultated. RRR. No lower extremity edema.   Gastrointestinal: Nontender, no masses. Bowel sounds normal.  Musculoskeletal: Gait normal.   Neurological: Normal balance/coordination. No tremor.   Skin: warm, dry, intact.   Psychiatric: Normal judgment/insight. Normal mood and affect.    ASSESSMENT/PLAN: The encounter diagnosis was Acute non-recurrent maxillary sinusitis.    Second sickening syndrome concerning for sinusitis  Meds ordered this encounter  Medications  .               . ipratropium (ATROVENT) 0.06 % nasal spray    Sig: Place 2 sprays into both nostrils 4 (four) times daily.    Dispense:  15 mL    Refill:  1  . amoxicillin-clavulanate (AUGMENTIN) 875-125 MG tablet    Sig: Take 1 tablet by mouth 2 (two) times daily for  7 days.    Dispense:  14 tablet    Refill:  0    Please cancel previous Rx for #20 / 10 days, correct Rx is 7 days thanks      Patient Instructions   Will trial Augmentin antibiotics for sinus infection  Can take probiotic with the medicine, and take with food  Will trial Atrovent nasal spray to decrease sinus congestion (good to have on hand for cold/flu symptoms)  If not  improved after 5-7 days, let me know!       Visit summary with medication list and pertinent instructions was printed for patient to review. All questions at time of visit were answered - patient instructed to contact office with any additional concerns or updates. ER/RTC precautions were reviewed with the patient.   Follow-up plan: Return if symptoms worsen or fail to improve.    Please note: voice recognition software was used to produce this document, and typos may escape review. Please contact Dr. Lyn HollingsheadAlexander for any needed clarifications.

## 2018-04-01 NOTE — Patient Instructions (Signed)
   Will trial Augmentin antibiotics for sinus infection  Can take probiotic with the medicine, and take with food  Will trial Atrovent nasal spray to decrease sinus congestion (good to have on hand for cold/flu symptoms)  If not improved after 5-7 days, let me know!

## 2018-04-02 ENCOUNTER — Ambulatory Visit: Payer: BC Managed Care – PPO | Admitting: Family Medicine

## 2018-06-25 ENCOUNTER — Telehealth: Payer: Self-pay | Admitting: Family Medicine

## 2018-06-25 MED ORDER — OSELTAMIVIR PHOSPHATE 75 MG PO CAPS: 75.0000 mg | ORAL_CAPSULE | Freq: Two times a day (BID) | ORAL | 0 refills | Status: DC

## 2018-06-25 NOTE — Telephone Encounter (Signed)
Son dx flu.  Pt sick for 24 hr.  Will send in tamiflu

## 2018-06-28 ENCOUNTER — Telehealth: Payer: Self-pay

## 2018-06-28 NOTE — Telephone Encounter (Signed)
Kristen Peterson was given Tamiflu by Dr Denyse Amass because her son tested for the flu. She also had symptoms of fever and body aches. She states she now has pressure in her face and headaches. She was advised by Dr Denyse Amass to call back if these symptoms became worse. I advised patient Dr Denyse Amass has gone for the day and she was never seen in the office by Dr Denyse Amass. Or at least there is not any documentation. She was wanting an antibiotic. Please advise.

## 2018-06-28 NOTE — Telephone Encounter (Signed)
Patient advised of recommendations.  

## 2018-06-28 NOTE — Telephone Encounter (Signed)
If her son had the flu and she is starting to have flulike symptoms than that is probably what it is.  She should finish the full course of Tamiflu, use Flonase twice a day, and if persistent symptoms over 7 to 10 days we will consider an antibiotic.

## 2018-07-12 ENCOUNTER — Telehealth: Payer: Self-pay

## 2018-07-12 MED ORDER — ONDANSETRON HCL 8 MG PO TABS: 4.0000 mg | ORAL_TABLET | Freq: Three times a day (TID) | ORAL | 0 refills | Status: DC | PRN

## 2018-07-12 NOTE — Telephone Encounter (Signed)
Patient advised.

## 2018-07-12 NOTE — Telephone Encounter (Signed)
Zofran prescription sent to pharmacy.

## 2018-07-12 NOTE — Telephone Encounter (Signed)
Kristen Peterson states she is nauseated. She states a stomach bug is going around the house. Denies fever, chills or sweats. She would like a prescription for Zofran sent to [facility]. Please advise.

## 2018-07-23 ENCOUNTER — Encounter: Payer: Self-pay | Admitting: Family Medicine

## 2018-07-23 NOTE — Progress Notes (Signed)
Pt called and is needing a letter stating it would be in the best interest not to take her asthmatic son to work with her. Due to the corona virus.Advised pt a would leave letter.at the front desk.

## 2018-07-24 ENCOUNTER — Encounter: Payer: Self-pay | Admitting: Family Medicine

## 2018-07-25 IMAGING — DX DG THORACOLUMBAR SPINE 2V
2 series · 2 of 2 positions shown · non-contrast
Comparison: None.

CLINICAL DATA: Evaluate scoliosis.  Back pain

EXAM:
THORACOLUMBAR SPINE 1V

[tl-spine ap]
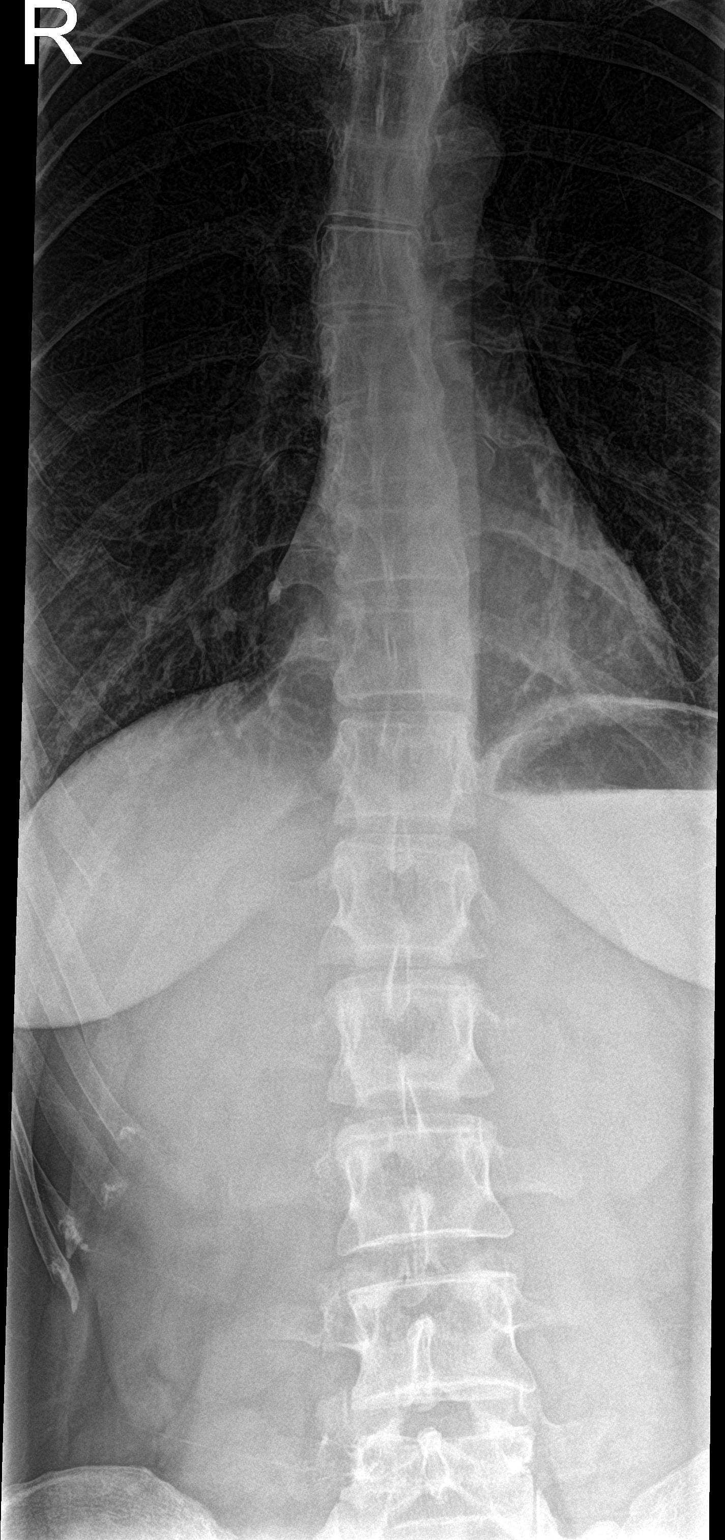

[tl-spine lat]
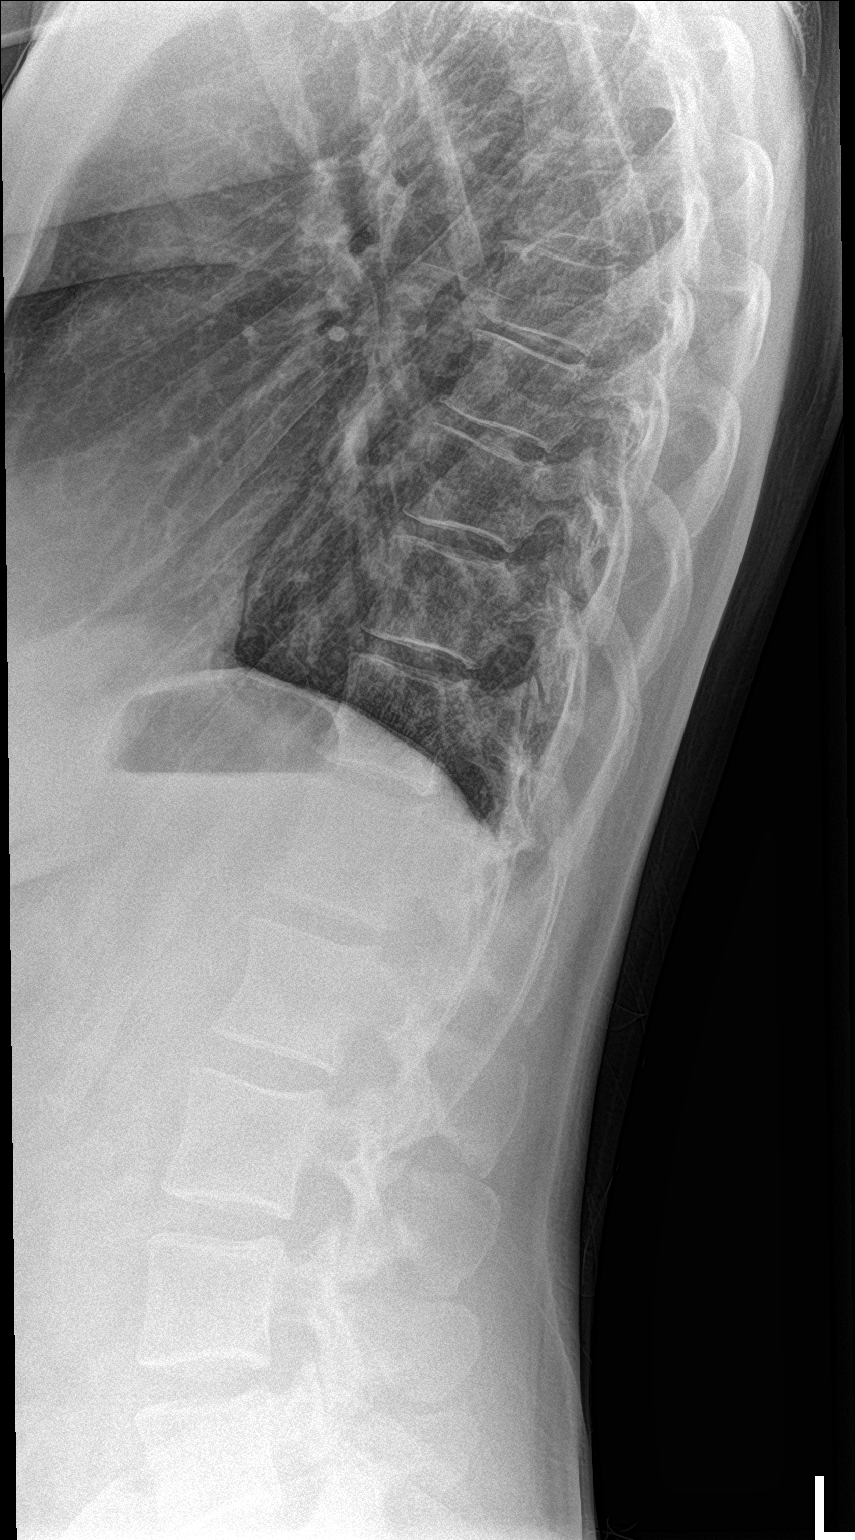

[2 of 2 positions shown; findings below may reference images not displayed]

FINDINGS: T1 and a portion T tumor visualize in the lateral cervical spine
study from today. T3 and T4 are not included on either study.

Normal alignment.  No fracture or mass.  Disc spaces intact.

Mild dextroscoliosis at approximately T7.
IMPRESSION: Mild dextroscoliosis.  No acute skeletal abnormality.

## 2018-07-25 IMAGING — DX DG CERVICAL SPINE COMPLETE 4+V
5 series · 5 of 5 positions shown · non-contrast
Comparison: None.

CLINICAL DATA: Neck pain

EXAM:
CERVICAL SPINE - COMPLETE 4+ VIEW

[c-spine lat]
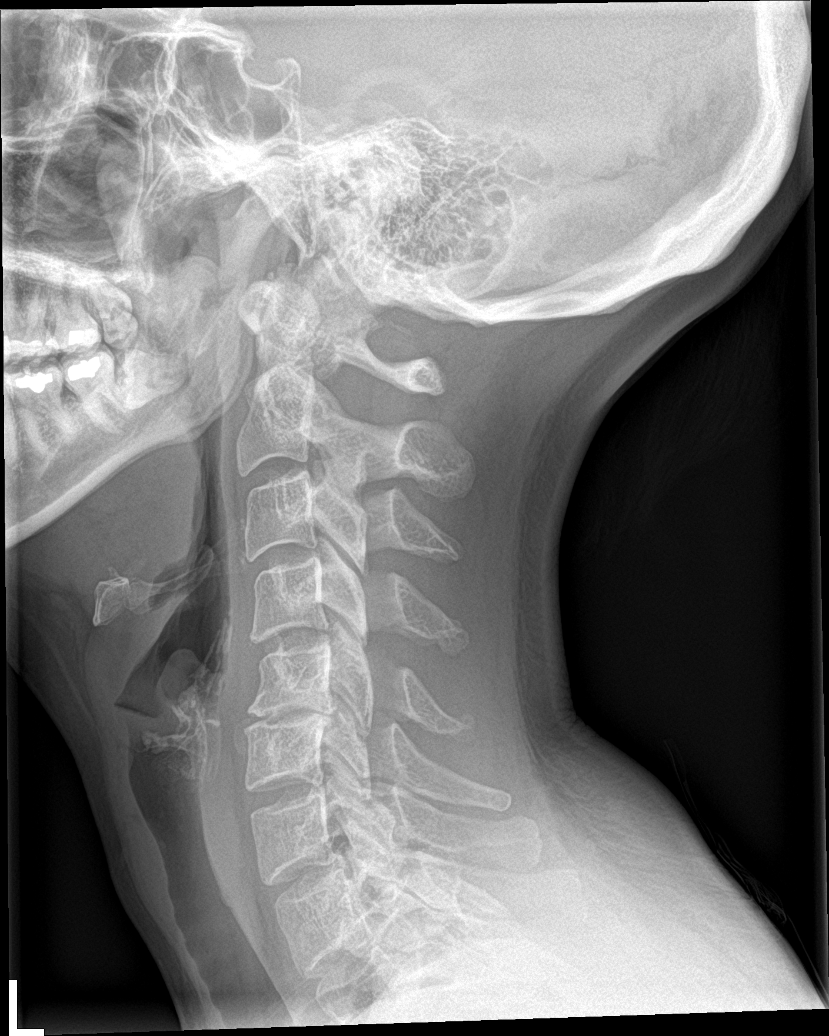

[c-spine obl (1 of 2)]
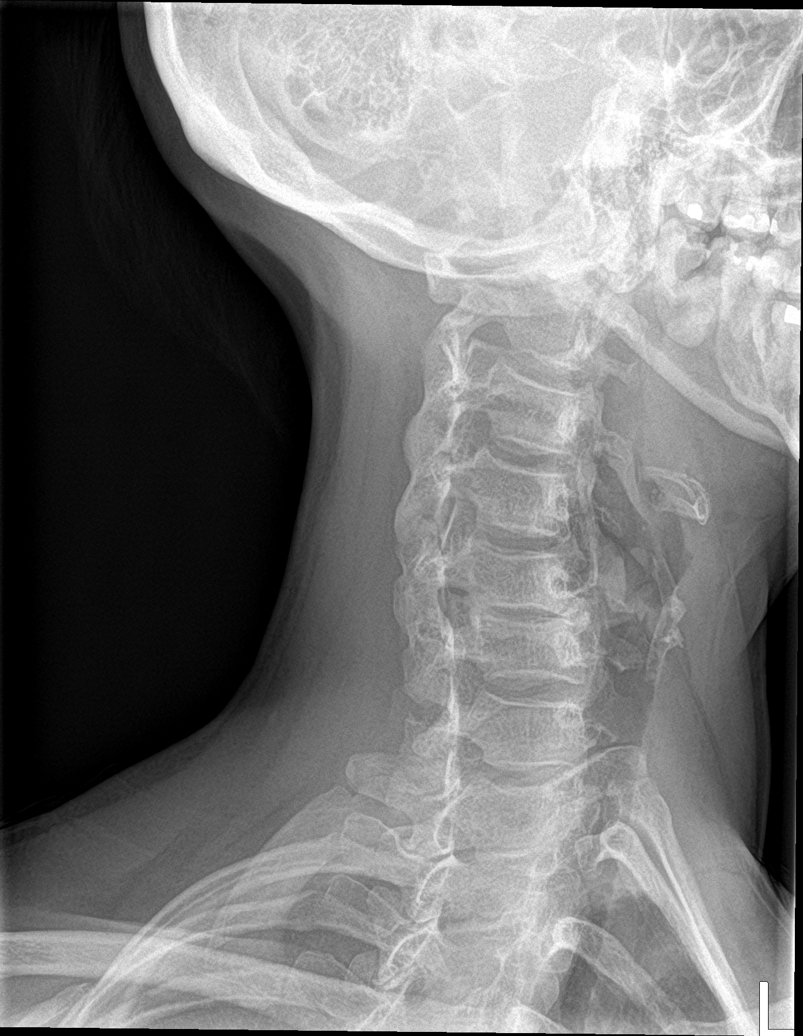

[c-spine obl (2 of 2)]
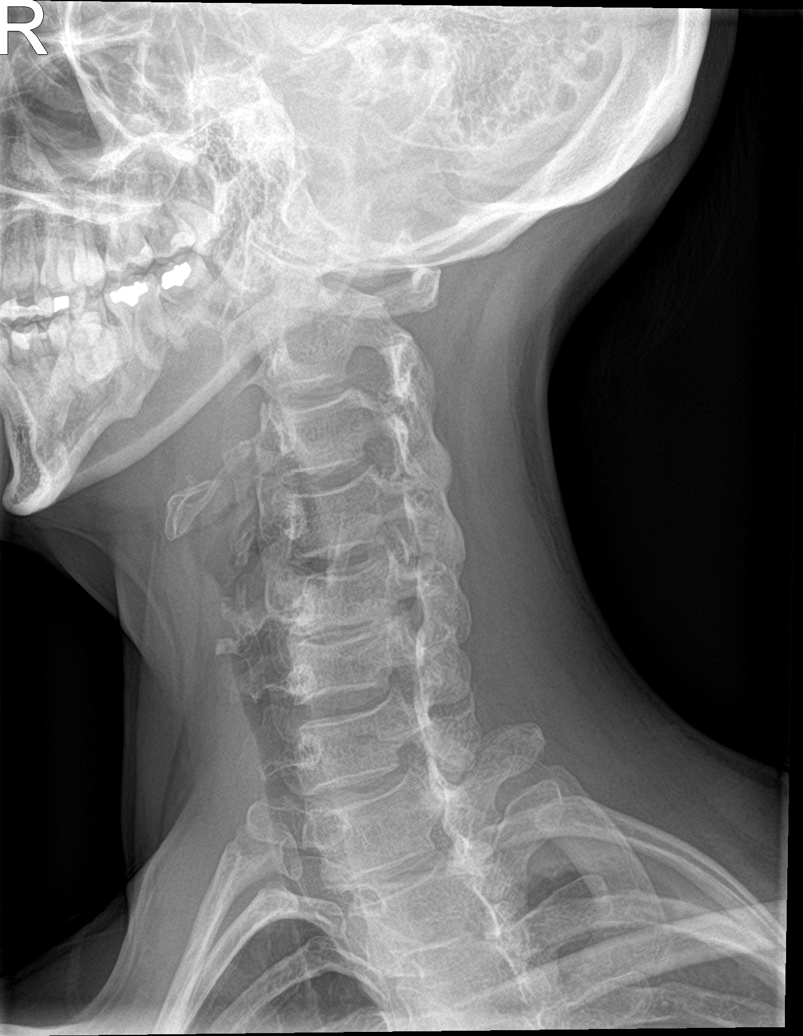

[c-spine ap]
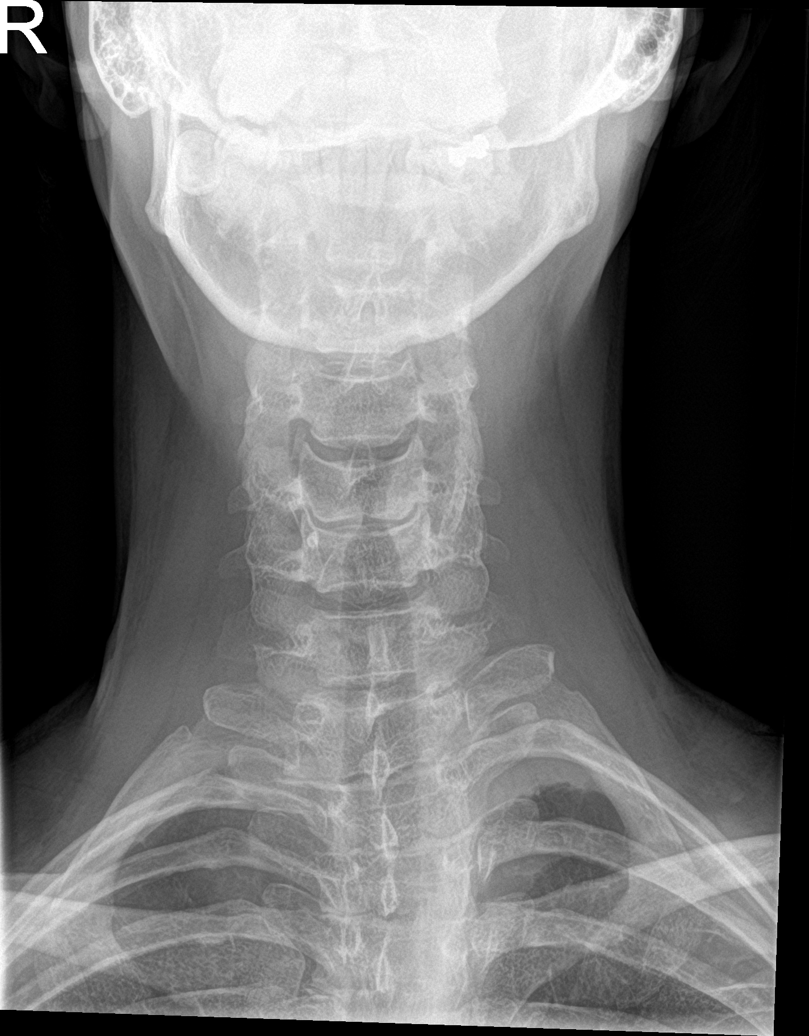

[c-spine open mouth]
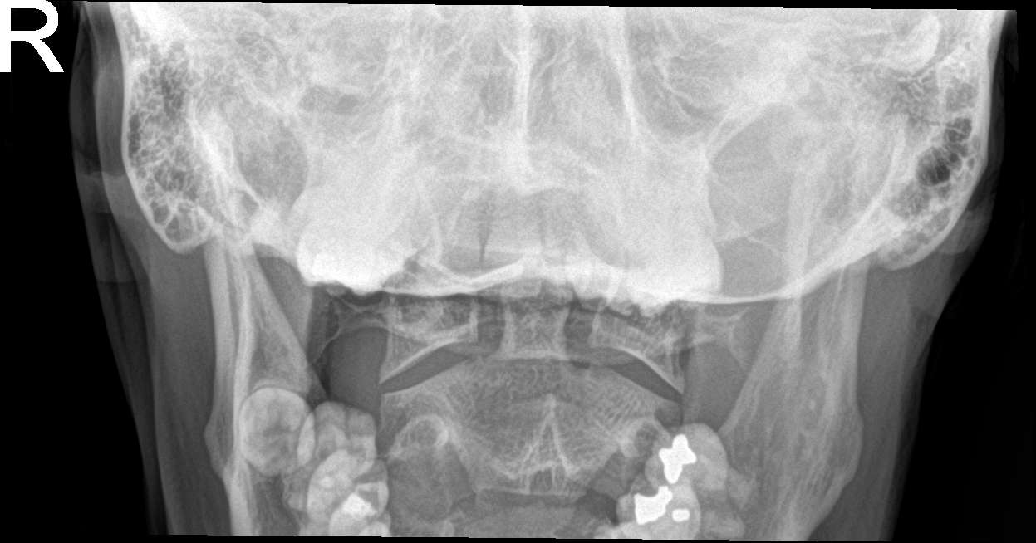

[5 of 5 positions shown; findings below may reference images not displayed]

FINDINGS: Mild retrolisthesis C5-6 with moderate disc degeneration and
spurring at C5-6. Straightening of the cervical lordosis. Mild
foraminal narrowing bilaterally C5-6 due to spurring. Remaining disc
spaces intact. Negative for fracture or mass. Soft tissues intact.
IMPRESSION: Disc degeneration and spondylosis at C5-6 causing foraminal
narrowing bilaterally.

## 2018-11-14 ENCOUNTER — Other Ambulatory Visit: Payer: Self-pay | Admitting: Family Medicine

## 2018-11-14 DIAGNOSIS — K219 Gastro-esophageal reflux disease without esophagitis: Secondary | ICD-10-CM

## 2019-02-15 ENCOUNTER — Other Ambulatory Visit: Payer: Self-pay | Admitting: Family Medicine

## 2019-02-15 DIAGNOSIS — K219 Gastro-esophageal reflux disease without esophagitis: Secondary | ICD-10-CM

## 2019-08-20 ENCOUNTER — Ambulatory Visit (INDEPENDENT_AMBULATORY_CARE_PROVIDER_SITE_OTHER): Payer: BC Managed Care – PPO | Admitting: Osteopathic Medicine

## 2019-08-20 ENCOUNTER — Other Ambulatory Visit: Payer: Self-pay

## 2019-08-20 ENCOUNTER — Encounter: Payer: Self-pay | Admitting: Osteopathic Medicine

## 2019-08-20 VITALS — BP 123/83 | HR 67 | Wt 128.0 lb

## 2019-08-20 DIAGNOSIS — M542 Cervicalgia: Secondary | ICD-10-CM | POA: Diagnosis not present

## 2019-08-20 MED ORDER — IBUPROFEN 800 MG PO TABS: 800.0000 mg | ORAL_TABLET | Freq: Three times a day (TID) | ORAL | 3 refills | Status: AC | PRN

## 2019-08-20 MED ORDER — CYCLOBENZAPRINE HCL 5 MG PO TABS: 5.0000 mg | ORAL_TABLET | Freq: Every evening | ORAL | 1 refills | Status: DC | PRN

## 2019-08-20 MED ORDER — PREDNISONE 20 MG PO TABS: 20.0000 mg | ORAL_TABLET | Freq: Two times a day (BID) | ORAL | 0 refills | Status: DC

## 2019-08-20 NOTE — Progress Notes (Signed)
Kristen Peterson is a 44 y.o. female who presents to  [facility]  today, 08/20/19, seeking care for the following: . Neck pain x 2 weeks, feels bulge on the R, tried muscle rubs, ibuprofen helping minimally      ASSESSMENT & PLAN with other pertinent history/findings:  The encounter diagnosis was Neck pain.  OMT applied to cervical and thoracic spine to (+)patient relief.   RTC prn additional treatments   Printed info on neck spasm home exercises     There are no Patient Instructions on file for this visit.   No orders of the defined types were placed in this encounter.   Meds ordered this encounter  Medications  . cyclobenzaprine (FLEXERIL) 5 MG tablet    Sig: Take 1-2 tablets (5-10 mg total) by mouth at bedtime as needed for muscle spasms.    Dispense:  90 tablet    Refill:  1  . predniSONE (DELTASONE) 20 MG tablet    Sig: Take 1 tablet (20 mg total) by mouth 2 (two) times daily with a meal.    Dispense:  10 tablet    Refill:  0  . ibuprofen (ADVIL) 800 MG tablet    Sig: Take 1 tablet (800 mg total) by mouth every 8 (eight) hours as needed. Take with pepcid or zantac    Dispense:  270 tablet    Refill:  3       Follow-up instructions: No follow-ups on file.                                         BP 123/83 (BP Location: Right Arm, Patient Position: Sitting, Cuff Size: Small)   Pulse 67   Wt 128 lb (58.1 kg)   SpO2 99%   BMI 20.05 kg/m   Current Meds  Medication Sig  . ALPRAZolam (XANAX) 0.25 MG tablet alprazolam 0.25 mg tablet  TAKE 1 TABLET BY MOUTH EVERY 8 HOURS AS NEEDED FOR ANXIETY  . cyclobenzaprine (FLEXERIL) 5 MG tablet Take 1-2 tablets (5-10 mg total) by mouth at bedtime as needed for muscle spasms.  Marland Kitchen escitalopram (LEXAPRO) 10 MG tablet Take 10 mg by mouth daily.  Marland Kitchen ibuprofen (ADVIL) 800 MG tablet Take 1 tablet (800 mg total) by mouth every 8 (eight)  hours as needed. Take with pepcid or zantac  . [DISCONTINUED] cyclobenzaprine (FLEXERIL) 5 MG tablet Take 1-2 tablets (5-10 mg total) by mouth at bedtime as needed for muscle spasms.  . [DISCONTINUED] ibuprofen (ADVIL,MOTRIN) 800 MG tablet Take 1 tablet (800 mg total) by mouth every 8 (eight) hours as needed. Take with pepcid or zantac    No results found for this or any previous visit (from the past 72 hour(s)).  No results found.  Depression screen [facility] 2/9 04/01/2018 11/13/2016  Decreased Interest 0 0  Down, Depressed, Hopeless 0 0  PHQ - 2 Score 0 0  Altered sleeping 2 -  Tired, decreased energy 2 -  Change in appetite 1 -  Feeling bad or failure about yourself  0 -  Trouble concentrating 0 -  Moving slowly or fidgety/restless 0 -  Suicidal thoughts 0 -  PHQ-9 Score 5 -  Difficult doing work/chores Somewhat difficult -    GAD 7 : Generalized Anxiety Score 04/01/2018  Nervous, Anxious, on Edge 0  Control/stop worrying 0  Worry too much -  different things 0  Trouble relaxing 0  Restless 0  Easily annoyed or irritable 0  Afraid - awful might happen 0  Total GAD 7 Score 0      All questions at time of visit were answered - patient instructed to contact office with any additional concerns or updates.  ER/RTC precautions were reviewed with the patient.  Please note: voice recognition software was used to produce this document, and typos may escape review. Please contact Dr. Sheppard Coil for any needed clarifications.

## 2019-08-26 ENCOUNTER — Other Ambulatory Visit: Payer: Self-pay | Admitting: Obstetrics and Gynecology

## 2019-08-26 DIAGNOSIS — R928 Other abnormal and inconclusive findings on diagnostic imaging of breast: Secondary | ICD-10-CM

## 2019-09-04 ENCOUNTER — Ambulatory Visit
Admission: RE | Admit: 2019-09-04 | Discharge: 2019-09-04 | Disposition: A | Discharge status: Home / Self Care | Payer: BC Managed Care – PPO | Source: Ambulatory Visit | Attending: Obstetrics and Gynecology | Admitting: Obstetrics and Gynecology

## 2019-09-04 ENCOUNTER — Other Ambulatory Visit: Payer: Self-pay

## 2019-09-04 DIAGNOSIS — R928 Other abnormal and inconclusive findings on diagnostic imaging of breast: Secondary | ICD-10-CM

## 2019-09-10 ENCOUNTER — Other Ambulatory Visit: Payer: BC Managed Care – PPO

## 2019-10-17 ENCOUNTER — Telehealth (INDEPENDENT_AMBULATORY_CARE_PROVIDER_SITE_OTHER): Payer: BC Managed Care – PPO | Admitting: Medical-Surgical

## 2019-10-17 ENCOUNTER — Encounter: Payer: Self-pay | Admitting: Medical-Surgical

## 2019-10-17 DIAGNOSIS — J0100 Acute maxillary sinusitis, unspecified: Secondary | ICD-10-CM

## 2019-10-17 DIAGNOSIS — J069 Acute upper respiratory infection, unspecified: Secondary | ICD-10-CM | POA: Diagnosis not present

## 2019-10-17 MED ORDER — AMOXICILLIN-POT CLAVULANATE 875-125 MG PO TABS: 1.0000 | ORAL_TABLET | Freq: Two times a day (BID) | ORAL | 0 refills | Status: AC

## 2019-10-17 NOTE — Progress Notes (Signed)
Virtual Visit via Video Note  I connected with Kristen Peterson on 10/17/19 at 10:10 AM EDT by a video enabled telemedicine application and verified that I am speaking with the correct person using two identifiers.   I discussed the limitations of evaluation and management by telemedicine and the availability of in person appointments. The patient expressed understanding and agreed to proceed.  Patient location: home Provider locations: office  Subjective:    CC: upper respiratory symptoms  HPI: Pleasant 44 year old female presenting via MyChart video visit with reports of 5 days of upper respiratory symptoms including sinus congestion, chest congestion, rhinorrhea, fatigue, facial pain/pressure mostly on the right side.  Reports symptoms were slow onset and she noted them to be worse in the evenings with mild improvement during the day.  Over the last 24 hours her symptoms have worsened significantly and she is now experiencing very fatigue and general malaise.  She has had a sporadic nonproductive cough and one episode of diarrhea.  Endorses intermittent chills but has had no fevers.  Notes that she has tenderness to palpation over the right maxillary sinus but none over the frontal area.  Has tried taking Alka-Seltzer cold and flu which helps for approximately 1 hour before her symptoms returned full force.    Past medical history, Surgical history, Family history not pertinant except as noted below, Social history, Allergies, and medications have been entered into the medical record, reviewed, and corrections made.   Review of Systems: See HPI for pertinent positives and negatives.   Objective:    General: Speaking clearly in complete sentences without any shortness of breath.  Alert and oriented x3.  Normal judgment. No apparent acute distress.   Impression and Recommendations:    1. Viral URI Likely origination of symptoms from viral URI.  Discussed supportive treatment and the role of  antibiotics in relation to viral conditions.  I will send the upper respiratory "shopping list" for OTC medications to her via MyChart when she activates it.  Encouraged increased fluids and increased rest.  2. Acute non-recurrent maxillary sinusitis Given worsening of symptoms over the past 24 hours and significant tenderness over the right maxillary sinus, will go ahead and treat with Augmentin twice daily x10 days.  Return if symptoms worsen or fail to improve.  20 minutes of non-face-to-face time was provided during this encounter.  I discussed the assessment and treatment plan with the patient. The patient was provided an opportunity to ask questions and all were answered. The patient agreed with the plan and demonstrated an understanding of the instructions.   The patient was advised to call back or seek an in-person evaluation if the symptoms worsen or if the condition fails to improve as anticipated.  Thayer Ohm, DNP, APRN, FNP-BC [facility]

## 2019-10-21 ENCOUNTER — Ambulatory Visit: Payer: BC Managed Care – PPO | Admitting: Osteopathic Medicine

## 2020-04-26 ENCOUNTER — Telehealth: Payer: Self-pay | Admitting: Neurology

## 2020-04-26 DIAGNOSIS — Z20828 Contact with and (suspected) exposure to other viral communicable diseases: Secondary | ICD-10-CM

## 2020-04-26 MED ORDER — OSELTAMIVIR PHOSPHATE 75 MG PO CAPS: 75.0000 mg | ORAL_CAPSULE | Freq: Every day | ORAL | 0 refills | Status: DC

## 2020-04-26 NOTE — Telephone Encounter (Signed)
Needs Tamiflu - [facility] [place] 

## 2020-11-05 LAB — HM MAMMOGRAPHY

## 2021-01-11 ENCOUNTER — Encounter: Payer: Self-pay | Admitting: Family Medicine

## 2021-01-11 ENCOUNTER — Ambulatory Visit (INDEPENDENT_AMBULATORY_CARE_PROVIDER_SITE_OTHER): Payer: BC Managed Care – PPO | Admitting: Family Medicine

## 2021-01-11 DIAGNOSIS — M545 Low back pain, unspecified: Secondary | ICD-10-CM | POA: Diagnosis not present

## 2021-01-14 NOTE — Assessment & Plan Note (Signed)
Her back pain has significantly improved today.  She will continue conservative treatment at home with OTC medications and home stretches.  Instructed to contact clinic if symptoms are worsening or she develops new symptoms.

## 2021-01-14 NOTE — Progress Notes (Signed)
Kristen Peterson - 45 y.o. female MRN [id number]  Date of birth: April 08, 1976  Subjective Chief Complaint  Patient presents with   Back Pain    HPI Kristen Peterson is a 45 year old female here today with complaint of low back pain.  Pain initially started about 3 days ago.  It is much better today.  She does feel little bit of tightness around the lower back.  Pain does not radiate.  She denies numbness, tingling or weakness in her lower extremities.  She has not had any urinary symptoms.  ROS:  A comprehensive ROS was completed and negative except as noted per HPI  No Known Allergies  Past Medical History:  Diagnosis Date   Abnormal Pap smear    ASCUS + HPV   AMA (advanced maternal age) multigravida 35+    Cystic fibrosis carrier    Cystic fibrosis carrier    GERD (gastroesophageal reflux disease)    Headache(784.0)     Past Surgical History:  Procedure Laterality Date   CESAREAN SECTION  05/22/2011   Procedure: CESAREAN SECTION;  Surgeon: Zelphia Cairo, MD;  Location: [facility];  Service: Gynecology;  Laterality: N/A;  primary/edc 06/16/11   COLPOSCOPY  2009   NO PAST SURGERIES      Social History   Socioeconomic History   Marital status: Married    Spouse name: Not on file   Number of children: Not on file   Years of education: Not on file   Highest education level: Not on file  Occupational History   Not on file  Tobacco Use   Smoking status: Never   Smokeless tobacco: Never  Vaping Use   Vaping Use: Never used  Substance and Sexual Activity   Alcohol use: Yes   Drug use: No   Sexual activity: Yes    Birth control/protection: None  Other Topics Concern   Not on file  Social History Narrative   Not on file   Social Determinants of Health   Financial Resource Strain: Not on file  Food Insecurity: Not on file  Transportation Needs: Not on file  Physical Activity: Not on file  Stress: Not on file  Social Connections: Not on file    Family History  Problem  Relation Age of Onset   Osteoporosis Mother    Thyroid disease Mother    Hypertension Father    Hyperlipidemia Father    Diverticulitis Father    Hypertension Maternal Grandmother    Cancer Maternal Grandmother        breast   Osteoporosis Maternal Grandmother    Thyroid disease Maternal Grandmother    Diabetes Maternal Grandmother    Diabetes Maternal Grandfather    Hypertension Maternal Grandfather     Health Maintenance  Topic Date Due   Hepatitis C Screening  Never done   TETANUS/TDAP  Never done   MAMMOGRAM  12/07/2017   PAP SMEAR-Modifier  12/07/2018   COLONOSCOPY (Pts 45-3yrs Insurance coverage will need to be confirmed)  Never done   INFLUENZA VACCINE  12/06/2020   HIV Screening  Completed   Pneumococcal Vaccine 5-46 Years old  Aged Out   HPV VACCINES  Aged Out     ----------------------------------------------------------------------------------------------------------------------------------------------------------------------------------------------------------------- Physical Exam BP (!) 145/78 (BP Location: Left Arm, Patient Position: Sitting, Cuff Size: Normal)   Pulse 100   Temp 97.9 F (36.6 C)   Ht 5\' 7"  (1.702 m)   Wt 133 lb (60.3 kg)   SpO2 96%   BMI 20.83 kg/m   Physical Exam  Constitutional:      Appearance: Normal appearance.  HENT:     Head: Normocephalic and atraumatic.  Eyes:     General: No scleral icterus. Cardiovascular:     Rate and Rhythm: Normal rate and regular rhythm.  Pulmonary:     Effort: Pulmonary effort is normal.     Breath sounds: Normal breath sounds.  Musculoskeletal:     Cervical back: Neck supple.  Neurological:     General: No focal deficit present.     Mental Status: She is alert.  Psychiatric:        Mood and Affect: Mood normal.     ------------------------------------------------------------------------------------------------------------------------------------------------------------------------------------------------------------------- Assessment and Plan  Lumbago Her back pain has significantly improved today.  She will continue conservative treatment at home with OTC medications and home stretches.  Instructed to contact clinic if symptoms are worsening or she develops new symptoms.    No orders of the defined types were placed in this encounter.   No follow-ups on file.    This visit occurred during the SARS-CoV-2 public health emergency.  Safety protocols were in place, including screening questions prior to the visit, additional usage of staff PPE, and extensive cleaning of exam room while observing appropriate contact time as indicated for disinfecting solutions.

## 2021-04-03 ENCOUNTER — Other Ambulatory Visit: Payer: Self-pay

## 2021-04-03 ENCOUNTER — Emergency Department
Admission: RE | Admit: 2021-04-03 | Discharge: 2021-04-03 | Disposition: A | Discharge status: Home / Self Care | Payer: BC Managed Care – PPO | Source: Ambulatory Visit | Attending: Family Medicine | Admitting: Family Medicine

## 2021-04-03 VITALS — BP 123/82 | HR 79 | Temp 98.9°F | Resp 16 | Ht 67.0 in | Wt 130.0 lb

## 2021-04-03 DIAGNOSIS — J101 Influenza due to other identified influenza virus with other respiratory manifestations: Secondary | ICD-10-CM

## 2021-04-03 LAB — POCT INFLUENZA A/B
Influenza A, POC: NEGATIVE
Influenza B, POC: POSITIVE — AB

## 2021-04-03 LAB — POCT RAPID STREP A (OFFICE): Rapid Strep A Screen: NEGATIVE

## 2021-04-03 MED ORDER — ONDANSETRON HCL 8 MG PO TABS: 8.0000 mg | ORAL_TABLET | Freq: Three times a day (TID) | ORAL | 0 refills | Status: DC | PRN

## 2021-04-03 MED ORDER — HYDROCODONE-ACETAMINOPHEN 5-325 MG PO TABS: 1.0000 | ORAL_TABLET | Freq: Four times a day (QID) | ORAL | 0 refills | Status: DC | PRN

## 2021-04-03 MED ORDER — OSELTAMIVIR PHOSPHATE 75 MG PO CAPS: 75.0000 mg | ORAL_CAPSULE | Freq: Two times a day (BID) | ORAL | 0 refills | Status: DC

## 2021-04-03 NOTE — ED Provider Notes (Signed)
[facility]    CSN: [id number] Arrival date & time: 04/03/21  1054      History   Chief Complaint Chief Complaint  Patient presents with   Sore Throat    HPI Kristen Peterson is a 45 y.o. female.   HPI Patient is a Chartered loss adjuster.  She has had some minor cough and cold symptoms, sinus congestion for couple weeks.  Thought it might be allergies.  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  Yesterday started with a sore throat.  Intensely painful sore throat.  She has headache and body aches.  Has been taking ibuprofen 800 mg with no improvement.  Can hardly swallow.  She states that the throat pain is "unbearable"  Past Medical History:  Diagnosis Date   Abnormal Pap smear    ASCUS + HPV   AMA (advanced maternal age) multigravida 35+    Cystic fibrosis carrier    Cystic fibrosis carrier    GERD (gastroesophageal reflux disease)    Headache(784.0)     Patient Active Problem List   Diagnosis Date Noted   Cervical spine pain 11/13/2016   Lumbago 11/13/2016   Scoliosis 11/13/2016   Leg length discrepancy 11/13/2016   Irritability 11/13/2016   GERD (gastroesophageal reflux disease) 11/13/2016    Past Surgical History:  Procedure Laterality Date   CESAREAN SECTION  05/22/2011   Procedure: CESAREAN SECTION;  Surgeon: Zelphia Cairo, MD;  Location: [facility];  Service: Gynecology;  Laterality: N/A;  primary/edc 06/16/11   COLPOSCOPY  2009   NO PAST SURGERIES      OB History     Gravida  3   Para  2   Term  1   Preterm  1   AB  1   Living  2      SAB  1   IAB  0   Ectopic  0   Multiple  0   Live Births  2            Home Medications    Prior to Admission medications   Medication Sig Start Date End Date Taking? Authorizing Provider  HYDROcodone-acetaminophen (NORCO/VICODIN) 5-325 MG tablet Take 1-2 tablets by mouth every 6 (six) hours as needed. 04/03/21  Yes Eustace Moore, MD  ibuprofen (ADVIL) 800 MG tablet Take 1 tablet (800  mg total) by mouth every 8 (eight) hours as needed. Take with pepcid or zantac 08/20/19  Yes Alexander, Dorene Grebe, DO  ondansetron (ZOFRAN) 8 MG tablet Take 1 tablet (8 mg total) by mouth every 8 (eight) hours as needed for nausea or vomiting. 04/03/21  Yes Eustace Moore, MD  oseltamivir (TAMIFLU) 75 MG capsule Take 1 capsule (75 mg total) by mouth every 12 (twelve) hours. 04/03/21  Yes Eustace Moore, MD  ALPRAZolam Prudy Feeler) 0.25 MG tablet alprazolam 0.25 mg tablet  TAKE 1 TABLET BY MOUTH EVERY 8 HOURS AS NEEDED FOR ANXIETY    [provider]  escitalopram (LEXAPRO) 10 MG tablet Take 10 mg by mouth daily. 11/09/16   [provider]  LO LOESTRIN FE 1 MG-10 MCG / 10 MCG tablet Take 1 tablet by mouth daily. 12/30/20   [provider]    Family History Family History  Problem Relation Age of Onset   Osteoporosis Mother    Thyroid disease Mother    Hypertension Father    Hyperlipidemia Father    Diverticulitis Father    Hypertension Maternal Grandmother    Cancer Maternal Grandmother  breast   Osteoporosis Maternal Grandmother    Thyroid disease Maternal Grandmother    Diabetes Maternal Grandmother    Diabetes Maternal Grandfather    Hypertension Maternal Grandfather     Social History Social History   Tobacco Use   Smoking status: Never   Smokeless tobacco: Never  Vaping Use   Vaping Use: Never used  Substance Use Topics   Alcohol use: Not Currently   Drug use: No     Allergies   Patient has no known allergies.   Review of Systems Review of Systems See HPI  Physical Exam Triage Vital Signs ED Triage Vitals  Enc Vitals Group     BP 04/03/21 1126 123/82     Pulse Rate 04/03/21 1126 79     Resp 04/03/21 1126 16     Temp 04/03/21 1126 98.9 F (37.2 C)     Temp Source 04/03/21 1126 Oral     SpO2 04/03/21 1126 96 %     Weight 04/03/21 1129 130 lb (59 kg)     Height 04/03/21 1129 5\' 7"  (1.702 m)     Head Circumference --       Peak Flow --      Pain Score 04/03/21 1129 6     Pain Loc --      Pain Edu? --      Excl. in GC? --    No data found.  Updated Vital Signs BP 123/82 (BP Location: Left Arm)   Pulse 79   Temp 98.9 F (37.2 C) (Oral)   Resp 16   Ht 5\' 7"  (1.702 m)   Wt 59 kg   LMP 03/13/2021 (Approximate) Comment: monthly, but irregular  SpO2 96%   Breastfeeding No   BMI 20.36 kg/m     Physical Exam Constitutional:      General: She is not in acute distress.    Appearance: She is well-developed. She is ill-appearing.  HENT:     Head: Normocephalic and atraumatic.     Right Ear: Tympanic membrane and ear canal normal.     Left Ear: Tympanic membrane and ear canal normal.     Nose: Rhinorrhea present.     Mouth/Throat:     Pharynx: Posterior oropharyngeal erythema present.  Eyes:     Conjunctiva/sclera: Conjunctivae normal.     Pupils: Pupils are equal, round, and reactive to light.  Cardiovascular:     Rate and Rhythm: Normal rate.     Heart sounds: Normal heart sounds.  Pulmonary:     Effort: Pulmonary effort is normal. No respiratory distress.     Breath sounds: Normal breath sounds.  Abdominal:     General: There is no distension.     Palpations: Abdomen is soft.  Musculoskeletal:        General: Normal range of motion.     Cervical back: Normal range of motion.  Lymphadenopathy:     Cervical: Cervical adenopathy present.  Skin:    General: Skin is warm and dry.  Neurological:     Mental Status: She is alert.     UC Treatments / Results  Labs (all labs ordered are listed, but only abnormal results are displayed) Labs Reviewed  POCT INFLUENZA A/B - Abnormal; Notable for the following components:      Result Value   Influenza B, POC Positive (*)    All other components within normal limits  POCT RAPID STREP A (OFFICE)    EKG   Radiology No results found.  Procedures Procedures (including critical care time)  Medications Ordered in UC Medications - No data to  display  Initial Impression / Assessment and Plan / UC Course  I have reviewed the triage vital signs and the nursing notes.  Pertinent labs & imaging results that were available during my care of the patient were reviewed by me and considered in my medical decision making (see chart for details).     Final Clinical Impressions(s) / UC Diagnoses   Final diagnoses:  Influenza B     Discharge Instructions      Drink lots of fluids Take the Tamiflu 2 times a day for 5 days  take Zofran if needed for nausea and vomiting May take over-the-counter cough and cold medicines as needed I have prescribed hydrocodone for severe pain.  Do not drive on hydrocodone.  Take with food Call if not able to return to work by Wednesday May return to work/activity when your symptoms have improved and you have no fever for 24 hours   ED Prescriptions     Medication Sig Dispense Auth. Provider   oseltamivir (TAMIFLU) 75 MG capsule Take 1 capsule (75 mg total) by mouth every 12 (twelve) hours. 10 capsule Eustace Moore, MD   ondansetron (ZOFRAN) 8 MG tablet Take 1 tablet (8 mg total) by mouth every 8 (eight) hours as needed for nausea or vomiting. 20 tablet Eustace Moore, MD   HYDROcodone-acetaminophen (NORCO/VICODIN) 5-325 MG tablet Take 1-2 tablets by mouth every 6 (six) hours as needed. 10 tablet Eustace Moore, MD      I have reviewed the PDMP during this encounter.   Eustace Moore, MD 04/06/21 [phone]

## 2021-04-03 NOTE — ED Triage Notes (Signed)
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

## 2021-04-03 NOTE — Discharge Instructions (Addendum)
Drink lots of fluids Take the Tamiflu 2 times a day for 5 days  take Zofran if needed for nausea and vomiting May take over-the-counter cough and cold medicines as needed I have prescribed hydrocodone for severe pain.  Do not drive on hydrocodone.  Take with food Call if not able to return to work by Wednesday May return to work/activity when your symptoms have improved and you have no fever for 24 hours

## 2021-04-04 ENCOUNTER — Encounter: Payer: Self-pay | Admitting: Medical-Surgical

## 2021-04-04 ENCOUNTER — Ambulatory Visit (INDEPENDENT_AMBULATORY_CARE_PROVIDER_SITE_OTHER): Payer: BC Managed Care – PPO | Admitting: Medical-Surgical

## 2021-04-04 VITALS — BP 123/84 | HR 81 | Temp 97.9°F | Resp 20 | Ht 67.0 in | Wt 128.5 lb

## 2021-04-04 DIAGNOSIS — B9789 Other viral agents as the cause of diseases classified elsewhere: Secondary | ICD-10-CM | POA: Diagnosis not present

## 2021-04-04 DIAGNOSIS — J101 Influenza due to other identified influenza virus with other respiratory manifestations: Secondary | ICD-10-CM

## 2021-04-04 DIAGNOSIS — J028 Acute pharyngitis due to other specified organisms: Secondary | ICD-10-CM | POA: Diagnosis not present

## 2021-04-04 LAB — POCT INFLUENZA A/B
Influenza A, POC: NEGATIVE
Influenza B, POC: POSITIVE — AB

## 2021-04-04 MED ORDER — METHYLPREDNISOLONE 4 MG PO TBPK: ORAL_TABLET | ORAL | 0 refills | Status: DC

## 2021-04-04 MED ORDER — AMBULATORY NON FORMULARY MEDICATION: 0 refills | Status: DC

## 2021-04-04 MED ORDER — METHYLPREDNISOLONE ACETATE 80 MG/ML IJ SUSP
80.0000 mg | Freq: Once | INTRAMUSCULAR | Status: AC
  Administered 2021-04-04: 10:00:00 80 mg via INTRAMUSCULAR

## 2021-04-04 MED ORDER — ONDANSETRON 8 MG PO TBDP: 8.0000 mg | ORAL_TABLET | Freq: Three times a day (TID) | ORAL | 3 refills | Status: DC | PRN

## 2021-04-04 MED ORDER — ONDANSETRON 4 MG PO TBDP
8.0000 mg | ORAL_TABLET | Freq: Once | ORAL | Status: AC
  Administered 2021-04-04: 10:00:00 8 mg via ORAL

## 2021-04-04 NOTE — Patient Instructions (Signed)
Start Medrol dose pack tomorrow 04/05/2021 Use Zofran 8mg  dissolving tablet every 8 hours prn nausea Continue Tamiflu Push PO fluids Increase rest Use over the counter cold and flu medications if needed for symptoms

## 2021-04-04 NOTE — Progress Notes (Signed)
  HPI with pertinent ROS:   CC: Sore throat  HPI: Pleasant 45 year old female accompanied by her husband presenting today for severe sore throat. She was seen at Urgent Care yesterday and found to be negative for strep but positive for Flu B. She was prescribed Tamiflu and Norco prn. She notes today that the medications have not helped her symptoms at all and she does not feel that Flu is what's going on with her. Her throat is very swollen and so sore that it hurts to swallow. Complains of muffled hearing and ear pressure. Having intermittent nausea and vomited on the way to this appointment.   I reviewed the past medical history, family history, social history, surgical history, and allergies today and no changes were needed.  Please see the problem list section below in epic for further details.   Physical exam:   General: Well Developed, well nourished, and in no acute distress.  Neuro: Alert and oriented x3.  HEENT: Normocephalic, atraumatic, pupils equal round reactive to light, neck supple, no masses, + submandibular lymphadenopathy, thyroid nonpalpable. Bilateral TMs intact without erythema or bulging. Posterior oropharyngeal erythema with tonsils 4+, no displacement of the uvula, no voice changes.  Skin: Warm and dry. Cardiac: Regular rate and rhythm, no murmurs rubs or gallops, no lower extremity edema.  Respiratory: Clear to auscultation bilaterally. Not using accessory muscles, speaking in full sentences.  Impression and Recommendations:    1. Influenza B 2. Sore throat (viral) Re-swabbed for flu with repeat positive results for flu B, confirming her result from yesterday. Covid testing completed. Zofran 8mg  ODT given in office with a prescription sent in for home use. Depo-medrol 80mg  given IM in office. Continue Tamiflu. Start Medrol dose pack tomorrow. Magic mouthwash sent to the pharmacy for prn use.  - methylPREDNISolone acetate (DEPO-MEDROL) injection 80 mg - ondansetron  (ZOFRAN-ODT) disintegrating tablet 8 mg - Novel Coronavirus, NAA ([facility]) - POCT Influenza A/B  Return if symptoms worsen or fail to improve. ___________________________________________ , DNP, APRN, FNP-BC Primary Care and Sports Medicine [facility] [place]

## 2021-04-05 ENCOUNTER — Telehealth: Payer: Self-pay

## 2021-04-05 LAB — NOVEL CORONAVIRUS, NAA: SARS-CoV-2, NAA: NOT DETECTED

## 2021-04-05 LAB — SARS-COV-2, NAA 2 DAY TAT

## 2021-04-05 NOTE — Telephone Encounter (Signed)
She is positive for Flu so an antibiotic is not going to help in this case. She should continue symptomatic treatment with OTC cold and flu medication and make sure to take Tamiflu as prescribed. Her symptoms will likely begin to resolve slowly but can take up to 10-14 days. If persisting past that, then we can re-evaluate for a possible bacterial infection.

## 2021-04-05 NOTE — Telephone Encounter (Signed)
Kristen Peterson called and left a message stating she is still having sinus issues and is requesting an antibiotic. Please advise.

## 2021-04-06 MED ORDER — AZITHROMYCIN 250 MG PO TABS: ORAL_TABLET | ORAL | 0 refills | Status: AC

## 2021-04-06 NOTE — Telephone Encounter (Signed)
Azithromycin sent to pharmacy.  This will not impact viral symptoms and there is no way to know what will help symptoms more, time passed versus an antibiotic.  If this is not helpful, she will need an appointment for further evaluation.  ___________________________________________ Thayer Ohm, DNP, APRN, FNP-BC Primary Care and Sports Medicine [facility] [place]

## 2021-04-06 NOTE — Telephone Encounter (Signed)
Patient advised of message and verbalized understanding. Patient stated she has been having facial pressure/pain and headache 2 weeks prior to testing positive for the flu and is concerned that she has a sinus infection as well.

## 2021-04-07 NOTE — Telephone Encounter (Signed)
Pt informed.  Pt expressed understanding and is agreeable.  T. Donnivan Villena, CMA  

## 2021-04-08 ENCOUNTER — Telehealth: Payer: Self-pay

## 2021-04-08 NOTE — Telephone Encounter (Signed)
Pt advised to call ofc if needs work note extended. Per Dr Joanie Coddington note, ok to extend note. Advised pt if needing FMLA paperwork, she would need to contact PCP. Pt acknowledged.

## 2021-06-12 DIAGNOSIS — I214 Non-ST elevation (NSTEMI) myocardial infarction: Secondary | ICD-10-CM | POA: Insufficient documentation

## 2021-06-13 DIAGNOSIS — I319 Disease of pericardium, unspecified: Secondary | ICD-10-CM | POA: Insufficient documentation

## 2021-06-14 ENCOUNTER — Telehealth: Payer: Self-pay

## 2021-06-14 NOTE — Telephone Encounter (Signed)
Patient called and left a voicemail about having a two day hospital stay recently and that today she was experiencing left leg pain. I called the patient back and recommended she go back to Urgent Care or the ED to be evaluated for a DVT. I also advised to call the post op team for their recommendations. She stayed with Smitty Cords and said she would call the post op care team for their recommendations.

## 2021-07-25 ENCOUNTER — Ambulatory Visit: Payer: BC Managed Care – PPO | Admitting: Physician Assistant

## 2021-07-25 ENCOUNTER — Encounter: Payer: Self-pay | Admitting: Physician Assistant

## 2021-07-25 ENCOUNTER — Other Ambulatory Visit: Payer: Self-pay

## 2021-07-25 ENCOUNTER — Ambulatory Visit (INDEPENDENT_AMBULATORY_CARE_PROVIDER_SITE_OTHER): Payer: BC Managed Care – PPO

## 2021-07-25 VITALS — BP 122/64 | HR 94 | Temp 98.1°F | Ht 67.0 in | Wt 128.0 lb

## 2021-07-25 DIAGNOSIS — J209 Acute bronchitis, unspecified: Secondary | ICD-10-CM | POA: Diagnosis not present

## 2021-07-25 DIAGNOSIS — Z8679 Personal history of other diseases of the circulatory system: Secondary | ICD-10-CM | POA: Diagnosis not present

## 2021-07-25 DIAGNOSIS — R0989 Other specified symptoms and signs involving the circulatory and respiratory systems: Secondary | ICD-10-CM

## 2021-07-25 DIAGNOSIS — R0602 Shortness of breath: Secondary | ICD-10-CM | POA: Diagnosis not present

## 2021-07-25 DIAGNOSIS — R5383 Other fatigue: Secondary | ICD-10-CM

## 2021-07-25 DIAGNOSIS — R051 Acute cough: Secondary | ICD-10-CM | POA: Diagnosis not present

## 2021-07-25 LAB — POCT INFLUENZA A/B
Influenza A, POC: NEGATIVE
Influenza B, POC: NEGATIVE

## 2021-07-25 MED ORDER — HYDROCOD POLI-CHLORPHE POLI ER 10-8 MG/5ML PO SUER: 5.0000 mL | Freq: Two times a day (BID) | ORAL | 0 refills | Status: DC | PRN

## 2021-07-25 MED ORDER — IPRATROPIUM-ALBUTEROL 0.5-2.5 (3) MG/3ML IN SOLN
3.0000 mL | Freq: Once | RESPIRATORY_TRACT | Status: AC
  Administered 2021-07-25: 3 mL via RESPIRATORY_TRACT

## 2021-07-25 MED ORDER — PREDNISONE 50 MG PO TABS: 50.0000 mg | ORAL_TABLET | Freq: Every day | ORAL | 0 refills | Status: DC

## 2021-07-25 MED ORDER — ALBUTEROL SULFATE (2.5 MG/3ML) 0.083% IN NEBU: 2.5000 mg | INHALATION_SOLUTION | RESPIRATORY_TRACT | 0 refills | Status: DC | PRN

## 2021-07-25 MED ORDER — BENZONATATE 200 MG PO CAPS: 200.0000 mg | ORAL_CAPSULE | Freq: Three times a day (TID) | ORAL | 0 refills | Status: DC | PRN

## 2021-07-25 NOTE — Progress Notes (Signed)
No worrisome findings on lung xray. Mild scoliosis. Treatment plan stays the same.

## 2021-07-25 NOTE — Progress Notes (Addendum)
? ?Subjective:  ? ? Patient ID: Kristen Peterson, female    DOB: [birth year], 46 y.o.   MRN: [id number] ? ?Cough ?Associated symptoms include chills and shortness of breath. Pertinent negatives include no ear pain, fever or myalgias.  ?Kristen Peterson is a 46 yo w/ a history of myopericarditis and bronchitis presenting to the clinic today w/ a cough.  ? ?Pt states sinus symptoms started 3 days ago that have now moved to her chest. She complains of chest heaviness, shortness of breath, a productive cough, and extreme fatigue. She denies any known fever, but does report chills last night. She has taken Pseuodoephedrine and "Severe Cold And Flu" medication. 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 She has not received a COVID or flu shot.  ? ?She did go to ED for myopericarditis in 2/5 and 2/6. Heart Cath was done and no blockages found. He was placed on ASA and Imdur but could not tolerate Imdur due to HAs.  ? ?.. ?Active Ambulatory Problems  ?  Diagnosis Date Noted  ? Cervical spine pain 11/13/2016  ? Lumbago 11/13/2016  ? Scoliosis 11/13/2016  ? Leg length discrepancy 11/13/2016  ? Irritability 11/13/2016  ? GERD (gastroesophageal reflux disease) 11/13/2016  ? SOB (shortness of breath) 07/25/2021  ? History of pericarditis 07/25/2021  ? Other fatigue 07/25/2021  ? Acute bronchitis 07/25/2021  ? ?Resolved Ambulatory Problems  ?  Diagnosis Date Noted  ? No Resolved Ambulatory Problems  ? ?Past Medical History:  ?Diagnosis Date  ? Abnormal Pap smear   ? AMA (advanced maternal age) multigravida 35+   ? Cystic fibrosis carrier   ? Cystic fibrosis carrier   ? Headache(784.0)   ? ? ? ? ?Review of Systems  ?Constitutional:  Positive for chills and fatigue. Negative for fever.  ?HENT:  Positive for congestion. Negative for ear pain.   ?Respiratory:  Positive for cough and shortness of breath.   ?Gastrointestinal:  Negative for diarrhea and nausea.  ?Musculoskeletal:  Negative for myalgias.  ? ?    ?Objective:  ? Physical Exam ?Constitutional:   ?   Appearance: She is ill-appearing.  ?HENT:  ?   Right Ear: No swelling. Tympanic membrane is bulging.  ?   Left Ear: Tympanic membrane normal.  ?Cardiovascular:  ?   Rate and Rhythm: Tachycardia present.  ?   Heart sounds: Normal heart sounds.  ?Pulmonary:  ?   Breath sounds: Wheezing present.  ?   Comments: Respiratory rhonchi bilateral lungs.  ?Chest:  ?   Chest wall: Tenderness present.  ?Musculoskeletal:  ?   Cervical back: No tenderness.  ?Psychiatric:     ?   Mood and Affect: Mood normal.  ? ?   ?Assessment & Plan:  ?46 yo presenting to the clinic w/ a cough today caused by suspected viral bronchitis.  ? ?..Kristen Peterson was seen today for cough. ? ?Diagnoses and all orders for this visit: ? ?Acute bronchitis, unspecified organism ?-     predniSONE (DELTASONE) 50 MG tablet; Take 1 tablet (50 mg total) by mouth daily. ?-     DG Chest 2 View; Future ?-     ipratropium-albuterol (DUONEB) 0.5-2.5 (3) MG/3ML nebulizer solution 3 mL ?-     albuterol (PROVENTIL) (2.5 MG/3ML) 0.083% nebulizer solution; Take 3 mLs (2.5 mg total) by nebulization every 4 (four) hours as needed for wheezing or shortness of breath (please include nebulizer machine, hoses, and mask if needed.). ?-  chlorpheniramine-HYDROcodone 10-8 MG/5ML; Take 5 mLs by mouth every 12 (twelve) hours as needed for cough (cough, will cause drowsiness.). ?-     benzonatate (TESSALON) 200 MG capsule; Take 1 capsule (200 mg total) by mouth 3 (three) times daily as needed. ? ?Acute cough ?-     POCT Influenza A/B ?-     Novel Coronavirus, NAA ([facility]) ?-     ipratropium-albuterol (DUONEB) 0.5-2.5 (3) MG/3ML nebulizer solution 3 mL ? ?Chest congestion ?-     POCT Influenza A/B ?-     Novel Coronavirus, NAA ([facility]) ?-     DG Chest 2 View; Future ?-     ipratropium-albuterol (DUONEB) 0.5-2.5 (3) MG/3ML nebulizer solution 3 mL ? ?History of pericarditis ?-     DG Chest 2 View; Future ? ?SOB (shortness of breath) ?-      DG Chest 2 View; Future ?-     ipratropium-albuterol (DUONEB) 0.5-2.5 (3) MG/3ML nebulizer solution 3 mL ? ?Other fatigue ? ? ? ? ?- Swabbed for flu and COVID. Flu was negative, awaiting COVID results.  ?-vitals signs were stable with pulse ox at 96 percent.  ?- Continue breathing treatment albuterol at home every 4-6 hours as needed ?Pt had great response to duoneb in office. Lungs had much improved. Pt felt great response as well.  ?- Obtained CXR to rule out bacterial/viral cause. CXR was negative for pneumonia. ?- Started on Prednisone 50 mg tab ?- Prescribed chlorpheniramine-HYDROcodone  ?10-8MG /5ML and benzonate (TESSALON) 200 mg for cough relief  ?..PDMP reviewed during this encounter. ? ?- Get plenty of rest and fluids within the next couple days ?-follow up as needed or if symptoms persisted.  ? ?

## 2021-07-25 NOTE — Patient Instructions (Addendum)
Will get CXR.  ?Start prednisone ?Use albuterol every 4-6 hours as needed for SOB/chest tightness ? ?Acute Bronchitis, Adult ?Acute bronchitis is when air tubes in the lungs (bronchi) suddenly get swollen. The condition can make it hard for you to breathe. In adults, acute bronchitis usually goes away within 2 weeks. A cough caused by bronchitis may last up to 3 weeks. Smoking, allergies, and asthma can make the condition worse. ?What are the causes? ?Germs that cause cold and flu (viruses). The most common cause of this condition is the virus that causes the common cold. ?Bacteria. ?Substances that bother (irritate) the lungs, including: ?Smoke from cigarettes and other types of tobacco. ?Dust and pollen. ?Fumes from chemicals, gases, or burned fuel. ?Indoor or outdoor air pollution. ?What increases the risk? ?A weak body's defense system. This is also called the immune system. ?Any condition that affects your lungs and breathing, such as asthma. ?What are the signs or symptoms? ?A cough. ?Coughing up clear, yellow, or green mucus. ?Making high-pitched whistling sounds when you breathe, most often when you breathe out (wheezing). ?Runny or stuffy nose. ?Having too much mucus in your lungs (chest congestion). ?Shortness of breath. ?Body aches. ?A sore throat. ?How is this treated? ?Acute bronchitis may go away over time without treatment. Your doctor may tell you to: ?Drink more fluids. This will help thin your mucus so it is easier to cough up. ?Use a device that gets medicine into your lungs (inhaler). ?Use a vaporizer or a humidifier. These are machines that add water to the air. This helps with coughing and poor breathing. ?Take a medicine that thins mucus and helps clear it from your lungs. ?Take a medicine that prevents or stops coughing. ?It is not common to take an antibiotic medicine for this condition. ?Follow these instructions at home: ? ?Take over-the-counter and prescription medicines only as told by  your doctor. ?Use an inhaler, vaporizer, or humidifier as told by your doctor. ?Take two teaspoons (10 mL) of honey at bedtime. This helps lessen your coughing at night. ?Drink enough fluid to keep your pee (urine) pale yellow. ?Do not smoke or use any products that contain nicotine or tobacco. If you need help quitting, ask your doctor. ?Get a lot of rest. ?Return to your normal activities when your doctor says that it is safe. ?Keep all follow-up visits. ?How is this prevented? ? ?Wash your hands often with soap and water for at least 20 seconds. If you cannot use soap and water, use hand sanitizer. ?Avoid contact with people who have cold symptoms. ?Try not to touch your mouth, nose, or eyes with your hands. ?Avoid breathing in smoke or chemical fumes. ?Make sure to get the flu shot every year. ?Contact a doctor if: ?Your symptoms do not get better in 2 weeks. ?You have trouble coughing up the mucus. ?Your cough keeps you awake at night. ?You have a fever. ?Get help right away if: ?You cough up blood. ?You have chest pain. ?You have very bad shortness of breath. ?You faint or keep feeling like you are going to faint. ?You have a very bad headache. ?Your fever or chills get worse. ?These symptoms may be an emergency. Get help right away. Call your local emergency services (911 in the [place] ?Do not wait to see if the symptoms will go away. ?Do not drive yourself to the hospital. ?Summary ?Acute bronchitis is when air tubes in the lungs (bronchi) suddenly get swollen. In adults, acute bronchitis usually goes away  within 2 weeks. ?Drink more fluids. This will help thin your mucus so it is easier to cough up. ?Take over-the-counter and prescription medicines only as told by your doctor. ?Contact a doctor if your symptoms do not improve after 2 weeks of treatment. ?This information is not intended to replace advice given to you by your health care provider. Make sure you discuss any questions you have with your health  care provider. ?Document Revised: 08/25/2020 Document Reviewed: 08/25/2020 ?Elsevier Patient Education ? 2022 [employer] ? ?

## 2021-07-26 LAB — NOVEL CORONAVIRUS, NAA: SARS-CoV-2, NAA: NOT DETECTED

## 2021-07-26 NOTE — Progress Notes (Signed)
Negative for covid. How are you feeling?

## 2021-08-26 ENCOUNTER — Ambulatory Visit: Payer: BC Managed Care – PPO | Admitting: Medical-Surgical

## 2021-08-26 ENCOUNTER — Encounter: Payer: Self-pay | Admitting: Medical-Surgical

## 2021-08-26 VITALS — BP 143/87 | HR 79 | Resp 20 | Ht 67.0 in | Wt 129.8 lb

## 2021-08-26 DIAGNOSIS — M25512 Pain in left shoulder: Secondary | ICD-10-CM

## 2021-08-26 MED ORDER — CYCLOBENZAPRINE HCL 5 MG PO TABS: 5.0000 mg | ORAL_TABLET | Freq: Three times a day (TID) | ORAL | 0 refills | Status: DC | PRN

## 2021-08-26 MED ORDER — PREDNISONE 50 MG PO TABS: 50.0000 mg | ORAL_TABLET | Freq: Every day | ORAL | 0 refills | Status: DC

## 2021-08-26 MED ORDER — MELOXICAM 15 MG PO TABS: 15.0000 mg | ORAL_TABLET | Freq: Every day | ORAL | 0 refills | Status: AC

## 2021-08-26 NOTE — Progress Notes (Signed)
?  HPI with pertinent ROS:  ? ?CC: left shoulder pain ? ?HPI: ?Pleasant 46 year old female presenting today for evaluation of left shoulder pain. Pain started around the time of her heart issues in February. Affects her upper chest just above her breast. Feeling it in her shoulder and going into her left upper arm. Also hurting under her shoulder blade on the left.  Pain is worse when taking a deep breath and when pressing on the affected areas.  Has taken Ibuprofen 800mg  that she takes twice daily with a little relief. Had some muscle relaxers from several years ago but they were not helpful (chart review showed Flexeril).  Was evaluated in the ED back in February and originally thought to have had an NSTEMI however this was ruled out with a left cardiac catheterization.  She was felt to have myopericarditis per hospital records.  Having difficulty sleeping at night which is not unusual for her however the pain has made it even more difficult.  Pain is most notable when she wakes in the morning.  She has been doing stretching exercises but over the last week, the pain has become nearly unbearable and constant.  No numbness, tingling, or circulatory symptoms to the left upper extremity. ? ?I reviewed the past medical history, family history, social history, surgical history, and allergies today and no changes were needed.  Please see the problem list section below in epic for further details. ? ? ?Physical exam:  ? ?General: Well Developed, well nourished, and in no acute distress.  ?Neuro: Alert and oriented x3.  ?HEENT: Normocephalic, atraumatic.  ?Skin: Warm and dry. ?Cardiac: Regular rate and rhythm, no murmurs rubs or gallops, no lower extremity edema.  ?Respiratory: Clear to auscultation bilaterally. Not using accessory muscles, speaking in full sentences. ? ?Impression and Recommendations:   ? ?1. Acute pain of left shoulder ?Unclear etiology.  Given the tenderness with palpation, suspect this is more of a  costochondritis or musculoskeletal issue.  Start prednisone 50 mg daily x5 days.  After finishing the prednisone burst, switch to meloxicam 15 mg daily.  Avoid other ibuprofen or NSAID products while on meloxicam.  Okay to use other conservative measures such as heat, ice, muscle rubs, massage, etc.  Printed exercises for shoulder conditioning provided with instructions to complete them twice daily.  Plan to follow-up with Dr. March at her establish care appointment in a couple of weeks. ? ?Return if symptoms worsen or fail to improve. ?___________________________________________ ?Ashley Royalty, DNP, APRN, FNP-BC ?Primary Care and Sports Medicine ?[facility] ?

## 2021-09-07 ENCOUNTER — Encounter: Payer: Self-pay | Admitting: Family Medicine

## 2021-09-07 ENCOUNTER — Ambulatory Visit (INDEPENDENT_AMBULATORY_CARE_PROVIDER_SITE_OTHER): Payer: BC Managed Care – PPO | Admitting: Family Medicine

## 2021-09-07 VITALS — BP 132/89 | HR 72 | Temp 97.9°F | Ht 67.0 in | Wt 130.0 lb

## 2021-09-07 DIAGNOSIS — I214 Non-ST elevation (NSTEMI) myocardial infarction: Secondary | ICD-10-CM

## 2021-09-07 DIAGNOSIS — R454 Irritability and anger: Secondary | ICD-10-CM

## 2021-09-07 DIAGNOSIS — G4700 Insomnia, unspecified: Secondary | ICD-10-CM | POA: Diagnosis not present

## 2021-09-07 MED ORDER — DOXEPIN HCL 3 MG PO TABS: ORAL_TABLET | ORAL | 1 refills | Status: AC

## 2021-09-07 NOTE — Assessment & Plan Note (Signed)
She does really have any issues with sleep initiation but has difficulty with maintaining sleep.  She has had to increase her alprazolam in the past I think she is developing some dependence and tolerance on this.  We will see if doxepin may work better for her as far as sleep maintenance.  If this is not helpful we can consider clonazepam due to longer half-life. ?

## 2021-09-07 NOTE — Progress Notes (Signed)
?Kristen Peterson - 46 y.o. female MRN [id number]  Date of birth: 16-Jun-1975 ? ?Subjective ?Chief Complaint  ?Patient presents with  ? Annual Exam  ? ? ?HPI ?Kristen Peterson is a 46 year old female here today for transfer of care visit.  She was hospitalized in February of this year due to NSTEMI.  Thought to be type II MI secondary to either vasospasm or myocarditis.  She did have respiratory infection prior to onset of symptoms.  Her catheterization showed clean coronaries.  Lab testing for additional risk factors did not show any significant elevations in cholesterol or glucose.  She has had follow-up with cardiology.  Echocardiogram shows preserved EF.  She has not had any further chest pain.  She denies dyspnea or other cardiac symptoms at this time. ? ?She is having difficulty with sleep.  Her GYN had put her on alprazolam which is helpful with her falling asleep however her main issue is waking frequently throughout the night. ? ?She does have some concerns about her bone health.  Her mother and grandmother are both diagnosed with osteoporosis.  She is not currently taking calcium or vitamin D supplement.  She is not very physically active. ? ?ROS:  A comprehensive ROS was completed and negative except as noted per HPI ? ? ?Allergies  ?Allergen Reactions  ? Imdur [Isosorbide Nitrate]   ?  headaches  ? ? ?Past Medical History:  ?Diagnosis Date  ? Abnormal Pap smear   ? ASCUS + HPV  ? AMA (advanced maternal age) multigravida 64+   ? Cystic fibrosis carrier   ? Cystic fibrosis carrier   ? GERD (gastroesophageal reflux disease)   ? Headache(784.0)   ? ? ?Past Surgical History:  ?Procedure Laterality Date  ? CESAREAN SECTION  05/22/2011  ? Procedure: CESAREAN SECTION;  Surgeon: Kristen Pearson, MD;  Location: [facility];  Service: Gynecology;  Laterality: N/A;  primary/edc 06/16/11  ? COLPOSCOPY  2009  ? NO PAST SURGERIES    ? ? ?Social History  ? ?Socioeconomic History  ? Marital status: Married  ?  Spouse name: Not on file  ?  Number of children: Not on file  ? Years of education: Not on file  ? Highest education level: Not on file  ?Occupational History  ? Not on file  ?Tobacco Use  ? Smoking status: Never  ? Smokeless tobacco: Never  ?Vaping Use  ? Vaping Use: Never used  ?Substance and Sexual Activity  ? Alcohol use: Not Currently  ? Drug use: No  ? Sexual activity: Yes  ?  Birth control/protection: None  ?Other Topics Concern  ? Not on file  ?Social History Narrative  ? Not on file  ? ?Social Determinants of Health  ? ?Financial Resource Strain: Not on file  ?Food Insecurity: Not on file  ?Transportation Needs: Not on file  ?Physical Activity: Not on file  ?Stress: Not on file  ?Social Connections: Not on file  ? ? ?Family History  ?Problem Relation Age of Onset  ? Osteoporosis Mother   ? Thyroid disease Mother   ? Hypertension Father   ? Hyperlipidemia Father   ? Diverticulitis Father   ? Hypertension Maternal Grandmother   ? Cancer Maternal Grandmother   ?     breast  ? Osteoporosis Maternal Grandmother   ? Thyroid disease Maternal Grandmother   ? Diabetes Maternal Grandmother   ? Diabetes Maternal Grandfather   ? Hypertension Maternal Grandfather   ? ? ?Health Maintenance  ?Topic Date Due  ? COVID-19  Vaccine (1) 09/11/2021 (Originally 07/05/1976)  ? MAMMOGRAM  01/06/2022 (Originally 12/07/2017)  ? PAP SMEAR-Modifier  01/06/2022 (Originally 12/07/2018)  ? TETANUS/TDAP  08/27/2022 (Originally 01/01/1994)  ? COLONOSCOPY (Pts 45-101yrs Insurance coverage will need to be confirmed)  09/08/2022 (Originally 01/02/2020)  ? Hepatitis C Screening  09/08/2022 (Originally 01/01/1993)  ? INFLUENZA VACCINE  12/06/2021  ? HIV Screening  Completed  ? HPV VACCINES  Aged Out  ? ? ? ?----------------------------------------------------------------------------------------------------------------------------------------------------------------------------------------------------------------- ?Physical Exam ?BP 132/89 (BP Location: Right Arm, Patient Position:  Sitting, Cuff Size: Small)   Pulse 72   Temp 97.9 ?F (36.6 ?C) (Oral)   Ht 5\' 7"  (1.702 m)   Wt 130 lb (59 kg)   SpO2 98%   BMI 20.36 kg/m?  ? ?Physical Exam ?Constitutional:   ?   Appearance: Normal appearance.  ?Eyes:  ?   General: No scleral icterus. ?Cardiovascular:  ?   Rate and Rhythm: Normal rate and regular rhythm.  ?Pulmonary:  ?   Effort: Pulmonary effort is normal.  ?   Breath sounds: Normal breath sounds.  ?Musculoskeletal:  ?   Cervical back: Neck supple.  ?Neurological:  ?   Mental Status: She is alert.  ?Psychiatric:     ?   Mood and Affect: Mood normal.     ?   Behavior: Behavior normal.  ? ? ?------------------------------------------------------------------------------------------------------------------------------------------------------------------------------------------------------------------- ?Assessment and Plan ? 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  Preserved EF without any continued symptoms at this time. ? ?Insomnia ?She does really have any issues with sleep initiation but has difficulty with maintaining sleep.  She has had to increase her alprazolam in the past I think she is developing some dependence and tolerance on this.  We will see if doxepin may work better for her as far as sleep maintenance.  If this is not helpful we can consider clonazepam due to longer half-life. ? ?Irritability ?Remains on Lexapro and is doing well with this. ? ? ?Meds ordered this encounter  ?Medications  ? Doxepin HCl 3 MG TABS  ?  Sig: Take 1 tab po qhs.  ?  Dispense:  30 tablet  ?  Refill:  1  ? ? ?No follow-ups on file. ? ? ? ?This visit occurred during the SARS-CoV-2 public health emergency.  Safety protocols were in place, including screening questions prior to the visit, additional usage of staff PPE, and extensive cleaning of exam room while observing appropriate contact time as indicated for disinfecting solutions.   ? ?

## 2021-09-07 NOTE — Assessment & Plan Note (Signed)
Remains on Lexapro and is doing well with this. ?

## 2021-09-07 NOTE — Assessment & Plan Note (Signed)
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  Preserved EF without any continued symptoms at this time. ?

## 2021-09-07 NOTE — Patient Instructions (Addendum)
Let's try doxepin at bedtime for sleep.   ?Let me know if this is not effective.  ? ?

## 2021-09-16 ENCOUNTER — Encounter: Payer: Self-pay | Admitting: Family Medicine

## 2022-06-06 ENCOUNTER — Ambulatory Visit
Admission: RE | Admit: 2022-06-06 | Discharge: 2022-06-06 | Disposition: A | Discharge status: Home / Self Care | Payer: BC Managed Care – PPO | Source: Ambulatory Visit | Attending: Family Medicine | Admitting: Family Medicine

## 2022-06-06 VITALS — BP 131/83 | HR 97 | Temp 98.4°F | Resp 17

## 2022-06-06 DIAGNOSIS — J111 Influenza due to unidentified influenza virus with other respiratory manifestations: Secondary | ICD-10-CM

## 2022-06-06 MED ORDER — PROMETHAZINE-DM 6.25-15 MG/5ML PO SYRP: 5.0000 mL | ORAL_SOLUTION | Freq: Four times a day (QID) | ORAL | 0 refills | Status: DC | PRN

## 2022-06-06 MED ORDER — OSELTAMIVIR PHOSPHATE 75 MG PO CAPS: 75.0000 mg | ORAL_CAPSULE | Freq: Two times a day (BID) | ORAL | 0 refills | Status: DC

## 2022-06-06 MED ORDER — AZITHROMYCIN 250 MG PO TABS: ORAL_TABLET | ORAL | 0 refills | Status: DC

## 2022-06-06 MED ORDER — FLUTICASONE PROPIONATE 50 MCG/ACT NA SUSP: 2.0000 | Freq: Every day | NASAL | 0 refills | Status: AC

## 2022-06-06 NOTE — Discharge Instructions (Addendum)
Drink lots of fluids Take Tamiflu 2 times a day for 5 days Use Flonase for the nasal and sinus congestion.  This will help with postnasal drip Take Phenergan DM for the cough.  This will cause drowsiness.  It will help you rest Call for problems Fill and take the Z-Pak if you fail to improve conservatively

## 2022-06-06 NOTE — ED Provider Notes (Signed)
[facility]    CSN: [id number] Arrival date & time: 06/06/22  1062      History   Chief Complaint Chief Complaint  Patient presents with   Generalized Body Aches    Flu exposure    HPI Kristen Peterson is a 47 y.o. female.   HPI Patient states her son tested positive for flu last Tuesday.  She has been sick since Friday.  She has body aches headache sinus congestion fatigue. She has been taking ibuprofen for pain and fever. 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  Worries about that. I told her that cannot be predicted or prevented.    Past Medical History:  Diagnosis Date   Abnormal Pap smear    ASCUS + HPV   AMA (advanced maternal age) multigravida 35+    Cystic fibrosis carrier    Cystic fibrosis carrier    GERD (gastroesophageal reflux disease)    Headache(784.0)     Patient Active Problem List   Diagnosis Date Noted   Insomnia 09/07/2021   SOB (shortness of breath) 07/25/2021   History of pericarditis 07/25/2021   Other fatigue 07/25/2021   Acute bronchitis 07/25/2021   Myopericarditis 06/13/2021   NSTEMI (non-ST elevated myocardial infarction) ([place]) 06/12/2021   Cervical spine pain 11/13/2016   Lumbago 11/13/2016   Scoliosis 11/13/2016   Leg length discrepancy 11/13/2016   Irritability 11/13/2016   GERD (gastroesophageal reflux disease) 11/13/2016    Past Surgical History:  Procedure Laterality Date   CESAREAN SECTION  05/22/2011   Procedure: CESAREAN SECTION;  Surgeon: Marylynn Pearson, MD;  Location: [facility];  Service: Gynecology;  Laterality: N/A;  primary/edc 06/16/11   COLPOSCOPY  2009   NO PAST SURGERIES      OB History     Gravida  3   Para  2   Term  1   Preterm  1   AB  1   Living  2      SAB  1   IAB  0   Ectopic  0   Multiple  0   Live Births  2            Home Medications    Prior to Admission medications   Medication Sig Start Date  End Date Taking? Authorizing Provider  azithromycin (ZITHROMAX Z-PAK) 250 MG tablet Take two pills today followed by one a day until gone 06/06/22  Yes Raylene Everts, MD  fluticasone [facility]) 50 MCG/ACT nasal spray Place 2 sprays into both nostrils daily. 06/06/22  Yes Raylene Everts, MD  oseltamivir (TAMIFLU) 75 MG capsule Take 1 capsule (75 mg total) by mouth every 12 (twelve) hours. 06/06/22  Yes Raylene Everts, MD  promethazine-dextromethorphan (PROMETHAZINE-DM) 6.25-15 MG/5ML syrup Take 5 mLs by mouth 4 (four) times daily as needed for cough. 06/06/22  Yes Raylene Everts, MD  ALPRAZolam Duanne Moron) 0.25 MG tablet alprazolam 0.25 mg tablet  TAKE 1 TABLET BY MOUTH EVERY 8 HOURS AS NEEDED FOR ANXIETY    [provider]  Doxepin HCl 3 MG TABS Take 1 tab po qhs. 09/07/21   Zigmund Daniel, [place], DO  escitalopram (LEXAPRO) 10 MG tablet Take 10 mg by mouth daily. 11/09/16   [provider]  ibuprofen (ADVIL) 800 MG tablet Take 1 tablet (800 mg total) by mouth every 8 (eight) hours as needed. Take with pepcid or zantac 08/20/19   Emeterio Reeve,  DO  LO LOESTRIN FE 1 MG-10 MCG / 10 MCG tablet Take 1 tablet by mouth daily. 12/30/20   [provider]  meloxicam (MOBIC) 15 MG tablet Take 1 tablet (15 mg total) by mouth daily. 08/26/21   Samuel Bouche, NP    Family History Family History  Problem Relation Age of Onset   Osteoporosis Mother    Thyroid disease Mother    Hypertension Father    Hyperlipidemia Father    Diverticulitis Father    Hypertension Maternal Grandmother    Cancer Maternal Grandmother        breast   Osteoporosis Maternal Grandmother    Thyroid disease Maternal Grandmother    Diabetes Maternal Grandmother    Diabetes Maternal Grandfather    Hypertension Maternal Grandfather     Social History Social History   Tobacco Use   Smoking status: Never   Smokeless tobacco: Never  Vaping Use   Vaping Use: Never used  Substance Use Topics   Alcohol  use: Not Currently   Drug use: No     Allergies   Imdur [isosorbide nitrate]   Review of Systems Review of Systems See HPI  Physical Exam Triage Vital Signs ED Triage Vitals  Enc Vitals Group     BP 06/06/22 1858 131/83     Pulse Rate 06/06/22 1858 97     Resp 06/06/22 1858 17     Temp 06/06/22 1858 98.4 F (36.9 C)     Temp Source 06/06/22 1858 Oral     SpO2 06/06/22 1858 93 %     Weight --      Height --      Head Circumference --      Peak Flow --      Pain Score 06/06/22 1901 1     Pain Loc --      Pain Edu? --      Excl. in [place]? --    No data found.  Updated Vital Signs BP 131/83 (BP Location: Left Arm)   Pulse 97   Temp 98.4 F (36.9 C) (Oral)   Resp 17   SpO2 93%   Visual Acuity Right Eye Distance:   Left Eye Distance:   Bilateral Distance:    Right Eye Near:   Left Eye Near:    Bilateral Near:     Physical Exam Constitutional:      General: She is not in acute distress.    Appearance: She is well-developed and normal weight. She is ill-appearing.  HENT:     Head: Normocephalic and atraumatic.     Right Ear: Tympanic membrane and ear canal normal.     Left Ear: Tympanic membrane and ear canal normal.     Nose: Congestion present.     Mouth/Throat:     Pharynx: Posterior oropharyngeal erythema present.  Eyes:     Conjunctiva/sclera: Conjunctivae normal.     Pupils: Pupils are equal, round, and reactive to light.  Cardiovascular:     Rate and Rhythm: Normal rate and regular rhythm.     Heart sounds: Normal heart sounds.  Pulmonary:     Effort: Pulmonary effort is normal. No respiratory distress.     Breath sounds: Normal breath sounds.  Abdominal:     General: There is no distension.     Palpations: Abdomen is soft.  Musculoskeletal:        General: Normal range of motion.     Cervical back: Normal range of motion.  Lymphadenopathy:  Cervical: No cervical adenopathy.  Skin:    General: Skin is warm and dry.  Neurological:      Mental Status: She is alert.      UC Treatments / Results  Labs (all labs ordered are listed, but only abnormal results are displayed) Labs Reviewed - No data to display  EKG   Radiology No results found.  Procedures Procedures (including critical care time)  Medications Ordered in UC Medications - No data to display  Initial Impression / Assessment and Plan / UC Course  I have reviewed the triage vital signs and the nursing notes.  Pertinent labs & imaging results that were available during my care of the patient were reviewed by me and considered in my medical decision making (see chart for details).     Final Clinical Impressions(s) / UC Diagnoses   Final diagnoses:  Influenza     Discharge Instructions      Drink lots of fluids Take Tamiflu 2 times a day for 5 days Use Flonase for the nasal and sinus congestion.  This will help with postnasal drip Take Phenergan DM for the cough.  This will cause drowsiness.  It will help you rest Call for problems Fill and take the Z-Pak if you fail to improve conservatively     ED Prescriptions     Medication Sig Dispense Auth. Provider   oseltamivir (TAMIFLU) 75 MG capsule Take 1 capsule (75 mg total) by mouth every 12 (twelve) hours. 10 capsule Raylene Everts, MD   fluticasone [facility]) 50 MCG/ACT nasal spray Place 2 sprays into both nostrils daily. 16 g Raylene Everts, MD   promethazine-dextromethorphan (PROMETHAZINE-DM) 6.25-15 MG/5ML syrup Take 5 mLs by mouth 4 (four) times daily as needed for cough. 118 mL Raylene Everts, MD   azithromycin (ZITHROMAX Z-PAK) 250 MG tablet Take two pills today followed by one a day until gone 6 tablet Meda Coffee Jennette Banker, MD      PDMP not reviewed this encounter.   Raylene Everts, MD 06/06/22 Dorthula Perfect

## 2022-06-06 NOTE — ED Triage Notes (Signed)
Pt c/o bodyaches and headache/sinus congestion since Fri. Son tested pos last Tuesday. Ibuprofen prn. Last dose 4pm.

## 2023-06-30 ENCOUNTER — Other Ambulatory Visit: Payer: Self-pay

## 2023-06-30 ENCOUNTER — Ambulatory Visit
Admission: RE | Admit: 2023-06-30 | Discharge: 2023-06-30 | Disposition: A | Discharge status: Home / Self Care | Payer: 59 | Source: Ambulatory Visit | Attending: Family Medicine | Admitting: Family Medicine

## 2023-06-30 VITALS — BP 103/69 | HR 99 | Temp 98.2°F | Resp 17

## 2023-06-30 DIAGNOSIS — R0981 Nasal congestion: Secondary | ICD-10-CM | POA: Diagnosis not present

## 2023-06-30 DIAGNOSIS — J0100 Acute maxillary sinusitis, unspecified: Secondary | ICD-10-CM | POA: Diagnosis not present

## 2023-06-30 MED ORDER — PREDNISONE 50 MG PO TABS: ORAL_TABLET | ORAL | 0 refills | Status: AC

## 2023-06-30 MED ORDER — AZITHROMYCIN 250 MG PO TABS: 250.0000 mg | ORAL_TABLET | Freq: Every day | ORAL | 0 refills | Status: AC

## 2023-06-30 NOTE — ED Provider Notes (Signed)
 [facility]    CSN: [id number] Arrival date & time: 06/30/23  1111      History   Chief Complaint Chief Complaint  Patient presents with   Nasal Congestion    possible sinus infection     HPI Zola Blowers is a 48 y.o. female.   HPI 48 year old female presents with possible sinus infection.  Reports sinus pressure and nasal congestion for 3 days.  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  Past Medical History:  Diagnosis Date   Abnormal Pap smear    ASCUS + HPV   AMA (advanced maternal age) multigravida 35+    Cystic fibrosis carrier    Cystic fibrosis carrier    GERD (gastroesophageal reflux disease)    Headache(784.0)     Patient Active Problem List   Diagnosis Date Noted   Insomnia 09/07/2021   SOB (shortness of breath) 07/25/2021   History of pericarditis 07/25/2021   Other fatigue 07/25/2021   Acute bronchitis 07/25/2021   Myopericarditis 06/13/2021   NSTEMI (non-ST elevated myocardial infarction) (HCC) 06/12/2021   Cervical spine pain 11/13/2016   Lumbago 11/13/2016   Scoliosis 11/13/2016   Leg length discrepancy 11/13/2016   Irritability 11/13/2016   GERD (gastroesophageal reflux disease) 11/13/2016    Past Surgical History:  Procedure Laterality Date   CESAREAN SECTION  05/22/2011   Procedure: CESAREAN SECTION;  Surgeon: Zelphia Cairo, MD;  Location: [facility];  Service: Gynecology;  Laterality: N/A;  primary/edc 06/16/11   COLPOSCOPY  2009   NO PAST SURGERIES      OB History     Gravida  3   Para  2   Term  1   Preterm  1   AB  1   Living  2      SAB  1   IAB  0   Ectopic  0   Multiple  0   Live Births  2            Home Medications    Prior to Admission medications   Medication Sig Start Date End Date Taking? Authorizing Provider  azithromycin (ZITHROMAX) 250 MG tablet Take 1 tablet (250 mg total) by mouth daily. Take first 2 tablets together, then 1 every day until finished.  06/30/23  Yes Trevor Iha, FNP  predniSONE (DELTASONE) 50 MG tablet Take 1 tab p.o. daily for 5 days. 06/30/23  Yes Trevor Iha, FNP  ALPRAZolam Prudy Feeler) 0.25 MG tablet alprazolam 0.25 mg tablet  TAKE 1 TABLET BY MOUTH EVERY 8 HOURS AS NEEDED FOR ANXIETY    [provider]  Doxepin HCl 3 MG TABS Take 1 tab po qhs. 09/07/21   Ashley Royalty, [place], DO  escitalopram (LEXAPRO) 10 MG tablet Take 10 mg by mouth daily. 11/09/16   [provider]  fluticasone (FLONASE) 50 MCG/ACT nasal spray Place 2 sprays into both nostrils daily. 06/06/22   Eustace Moore, MD  ibuprofen (ADVIL) 800 MG tablet Take 1 tablet (800 mg total) by mouth every 8 (eight) hours as needed. Take with pepcid or zantac 08/20/19   Sunnie Nielsen, DO  LO LOESTRIN FE 1 MG-10 MCG / 10 MCG tablet Take 1 tablet by mouth daily. 12/30/20   [provider]  meloxicam (MOBIC) 15 MG tablet Take 1 tablet (15 mg total) by mouth daily. 08/26/21   Christen Butter, NP    Family History Family History  Problem Relation Age of Onset   Osteoporosis Mother    Thyroid disease Mother  Hypertension Father    Hyperlipidemia Father    Diverticulitis Father    Hypertension Maternal Grandmother    Cancer Maternal Grandmother        breast   Osteoporosis Maternal Grandmother    Thyroid disease Maternal Grandmother    Diabetes Maternal Grandmother    Diabetes Maternal Grandfather    Hypertension Maternal Grandfather     Social History Social History   Tobacco Use   Smoking status: Never   Smokeless tobacco: Never  Vaping Use   Vaping status: Never Used  Substance Use Topics   Alcohol use: Not Currently   Drug use: No     Allergies   Imdur [isosorbide nitrate]   Review of Systems Review of Systems  HENT:  Positive for sinus pressure and sinus pain.   All other systems reviewed and are negative.    Physical Exam Triage Vital Signs ED Triage Vitals  Encounter Vitals Group     BP 06/30/23 1123 103/69      Systolic BP Percentile --      Diastolic BP Percentile --      Pulse Rate 06/30/23 1123 99     Resp 06/30/23 1123 17     Temp 06/30/23 1123 98.2 F (36.8 C)     Temp Source 06/30/23 1123 Oral     SpO2 06/30/23 1123 95 %     Weight --      Height --      Head Circumference --      Peak Flow --      Pain Score 06/30/23 1124 5     Pain Loc --      Pain Education --      Exclude from Growth Chart --    No data found.  Updated Vital Signs BP 103/69 (BP Location: Left Arm)   Pulse 99   Temp 98.2 F (36.8 C) (Oral)   Resp 17   SpO2 95%      Physical Exam Vitals and nursing note reviewed.  Constitutional:      Appearance: Normal appearance. She is normal weight.  HENT:     Head: Normocephalic and atraumatic.     Right Ear: Tympanic membrane, ear canal and external ear normal.     Left Ear: Tympanic membrane, ear canal and external ear normal.     Mouth/Throat:     Mouth: Mucous membranes are moist.     Pharynx: Oropharynx is clear.  Eyes:     Extraocular Movements: Extraocular movements intact.     Conjunctiva/sclera: Conjunctivae normal.     Pupils: Pupils are equal, round, and reactive to light.  Cardiovascular:     Rate and Rhythm: Normal rate and regular rhythm.     Pulses: Normal pulses.     Heart sounds: Normal heart sounds.  Pulmonary:     Effort: Pulmonary effort is normal.     Breath sounds: Normal breath sounds. No wheezing, rhonchi or rales.  Musculoskeletal:        General: Normal range of motion.     Cervical back: Normal range of motion and neck supple.  Skin:    General: Skin is warm and dry.  Neurological:     General: No focal deficit present.     Mental Status: She is alert and oriented to person, place, and time. Mental status is at baseline.  Psychiatric:        Mood and Affect: Mood normal.        Behavior: Behavior normal.  Thought Content: Thought content normal.      UC Treatments / Results  Labs (all labs ordered are listed,  but only abnormal results are displayed) Labs Reviewed - No data to display  EKG   Radiology No results found.  Procedures Procedures (including critical care time)  Medications Ordered in UC Medications - No data to display  Initial Impression / Assessment and Plan / UC Course  I have reviewed the triage vital signs and the nursing notes.  Pertinent labs & imaging results that were available during my care of the patient were reviewed by me and considered in my medical decision making (see chart for details).     MDM: 1.  Subacute maxillary sinusitis-Rx'd Zithromax (500 mg day 1, then 250 mg day 2-5).;  2.  Congestion of nasal sinus-Rx'd prednisone 50 mg tablet: Take 1 tablet daily x 5 days. Advised patient to take medications as directed with food to completion.  Advised patient to take prednisone with Zithromax daily for the next 5 days.  Encouraged increase daily water intake to 64 ounces per day while taking these medications.  Advised if symptoms worsen and/or unresolved please follow-up with PCP or here for further evaluation.  Patient discharged home, hemodynamically stable. Final Clinical Impressions(s) / UC Diagnoses   Final diagnoses:  Subacute maxillary sinusitis  Congestion of nasal sinus     Discharge Instructions      Advised patient to take medications as directed with food to completion.  Advised patient to take prednisone with Zithromax daily for the next 5 days.  Encouraged increase daily water intake to 64 ounces per day while taking these medications.  Advised if symptoms worsen and/or unresolved please follow-up with PCP or here for further evaluation.     ED Prescriptions     Medication Sig Dispense Auth. Provider   azithromycin (ZITHROMAX) 250 MG tablet Take 1 tablet (250 mg total) by mouth daily. Take first 2 tablets together, then 1 every day until finished. 6 tablet Trevor Iha, FNP   predniSONE (DELTASONE) 50 MG tablet Take 1 tab p.o. daily  for 5 days. 5 tablet Trevor Iha, FNP      PDMP not reviewed this encounter.   Trevor Iha, FNP 06/30/23 1157

## 2023-06-30 NOTE — ED Triage Notes (Signed)
 Pt c/o nasal congestion and sinus pressure/pain x 3 days. Denies fever. Taking dayquil prn. Daughter also sick with similar sxs, tested neg for strep, covid and flu.

## 2023-06-30 NOTE — Discharge Instructions (Addendum)
 Advised patient to take medications as directed with food to completion.  Advised patient to take prednisone with Zithromax daily for the next 5 days.  Encouraged increase daily water intake to 64 ounces per day while taking these medications.  Advised if symptoms worsen and/or unresolved please follow-up with PCP or here for further evaluation.
